# Patient Record
Sex: Male | Born: 1980 | Race: White | Hispanic: No | Marital: Married | Smoking: Current every day smoker
Health system: Southern US, Community
[De-identification: ages and names within clinical notes are randomized; demographics above are authoritative.]

## PROBLEM LIST (undated history)

## (undated) DIAGNOSIS — M1990 Unspecified osteoarthritis, unspecified site: Secondary | ICD-10-CM

## (undated) DIAGNOSIS — J40 Bronchitis, not specified as acute or chronic: Secondary | ICD-10-CM

## (undated) DIAGNOSIS — IMO0002 Reserved for concepts with insufficient information to code with codable children: Secondary | ICD-10-CM

---

## 2001-07-27 ENCOUNTER — Emergency Department (HOSPITAL_COMMUNITY): Admission: EM | Admit: 2001-07-27 | Discharge: 2001-07-27 | Payer: Self-pay | Admitting: Emergency Medicine

## 2002-07-11 ENCOUNTER — Emergency Department (HOSPITAL_COMMUNITY): Admission: EM | Admit: 2002-07-11 | Discharge: 2002-07-11 | Payer: Self-pay | Admitting: Emergency Medicine

## 2002-07-11 ENCOUNTER — Encounter: Payer: Self-pay | Admitting: Emergency Medicine

## 2005-07-30 ENCOUNTER — Emergency Department (HOSPITAL_COMMUNITY): Admission: EM | Admit: 2005-07-30 | Discharge: 2005-07-30 | Payer: Self-pay | Admitting: Emergency Medicine

## 2005-08-02 ENCOUNTER — Emergency Department (HOSPITAL_COMMUNITY): Admission: EM | Admit: 2005-08-02 | Discharge: 2005-08-02 | Payer: Self-pay | Admitting: Emergency Medicine

## 2006-04-07 ENCOUNTER — Emergency Department (HOSPITAL_COMMUNITY): Admission: EM | Admit: 2006-04-07 | Discharge: 2006-04-07 | Payer: Self-pay | Admitting: Emergency Medicine

## 2006-08-25 ENCOUNTER — Emergency Department (HOSPITAL_COMMUNITY): Admission: EM | Admit: 2006-08-25 | Discharge: 2006-08-25 | Payer: Self-pay | Admitting: Emergency Medicine

## 2009-12-22 ENCOUNTER — Emergency Department (HOSPITAL_COMMUNITY): Admission: EM | Admit: 2009-12-22 | Discharge: 2009-12-22 | Payer: Self-pay | Admitting: Emergency Medicine

## 2010-07-27 ENCOUNTER — Emergency Department (HOSPITAL_COMMUNITY): Admission: EM | Admit: 2010-07-27 | Discharge: 2010-07-28 | Payer: Self-pay | Admitting: Emergency Medicine

## 2011-01-26 ENCOUNTER — Emergency Department (HOSPITAL_COMMUNITY)
Admission: EM | Admit: 2011-01-26 | Discharge: 2011-01-27 | Disposition: A | Discharge status: Home / Self Care | Payer: Self-pay | Attending: Emergency Medicine | Admitting: Emergency Medicine

## 2011-01-26 DIAGNOSIS — S335XXA Sprain of ligaments of lumbar spine, initial encounter: Secondary | ICD-10-CM | POA: Insufficient documentation

## 2011-01-26 DIAGNOSIS — X503XXA Overexertion from repetitive movements, initial encounter: Secondary | ICD-10-CM | POA: Insufficient documentation

## 2011-01-26 DIAGNOSIS — M545 Low back pain, unspecified: Secondary | ICD-10-CM | POA: Insufficient documentation

## 2011-01-26 DIAGNOSIS — G8929 Other chronic pain: Secondary | ICD-10-CM | POA: Insufficient documentation

## 2011-01-29 ENCOUNTER — Emergency Department (HOSPITAL_COMMUNITY)
Admission: EM | Admit: 2011-01-29 | Discharge: 2011-01-29 | Disposition: A | Discharge status: Home / Self Care | Payer: Self-pay | Attending: Emergency Medicine | Admitting: Emergency Medicine

## 2011-01-29 DIAGNOSIS — M545 Low back pain, unspecified: Secondary | ICD-10-CM | POA: Insufficient documentation

## 2011-01-29 DIAGNOSIS — S335XXA Sprain of ligaments of lumbar spine, initial encounter: Secondary | ICD-10-CM | POA: Insufficient documentation

## 2011-01-29 DIAGNOSIS — F17200 Nicotine dependence, unspecified, uncomplicated: Secondary | ICD-10-CM | POA: Insufficient documentation

## 2011-01-29 DIAGNOSIS — Y929 Unspecified place or not applicable: Secondary | ICD-10-CM | POA: Insufficient documentation

## 2011-01-29 DIAGNOSIS — X503XXA Overexertion from repetitive movements, initial encounter: Secondary | ICD-10-CM | POA: Insufficient documentation

## 2011-06-26 ENCOUNTER — Emergency Department (HOSPITAL_COMMUNITY)
Admission: EM | Admit: 2011-06-26 | Discharge: 2011-06-27 | Disposition: A | Discharge status: Home / Self Care | Payer: Self-pay | Attending: Emergency Medicine | Admitting: Emergency Medicine

## 2011-06-26 ENCOUNTER — Emergency Department (HOSPITAL_COMMUNITY): Payer: Self-pay

## 2011-06-26 DIAGNOSIS — J40 Bronchitis, not specified as acute or chronic: Secondary | ICD-10-CM | POA: Insufficient documentation

## 2011-06-26 DIAGNOSIS — F17200 Nicotine dependence, unspecified, uncomplicated: Secondary | ICD-10-CM | POA: Insufficient documentation

## 2011-07-22 ENCOUNTER — Emergency Department (HOSPITAL_COMMUNITY): Payer: Self-pay

## 2011-07-22 ENCOUNTER — Emergency Department (HOSPITAL_COMMUNITY)
Admission: EM | Admit: 2011-07-22 | Discharge: 2011-07-22 | Disposition: A | Discharge status: Home / Self Care | Payer: Self-pay | Attending: Emergency Medicine | Admitting: Emergency Medicine

## 2011-07-22 ENCOUNTER — Other Ambulatory Visit: Payer: Self-pay

## 2011-07-22 ENCOUNTER — Encounter: Payer: Self-pay | Admitting: *Deleted

## 2011-07-22 DIAGNOSIS — F17200 Nicotine dependence, unspecified, uncomplicated: Secondary | ICD-10-CM | POA: Insufficient documentation

## 2011-07-22 DIAGNOSIS — J40 Bronchitis, not specified as acute or chronic: Secondary | ICD-10-CM | POA: Insufficient documentation

## 2011-07-22 DIAGNOSIS — R079 Chest pain, unspecified: Secondary | ICD-10-CM | POA: Insufficient documentation

## 2011-07-22 HISTORY — DX: Bronchitis, not specified as acute or chronic: J40

## 2011-07-22 MED ORDER — ALBUTEROL SULFATE HFA 108 (90 BASE) MCG/ACT IN AERS
2.0000 | INHALATION_SPRAY | Freq: Once | RESPIRATORY_TRACT | Status: AC
  Administered 2011-07-22: 2 via RESPIRATORY_TRACT
  Filled 2011-07-22: qty 6.7

## 2011-07-22 MED ORDER — ALBUTEROL SULFATE (5 MG/ML) 0.5% IN NEBU
2.5000 mg | INHALATION_SOLUTION | Freq: Once | RESPIRATORY_TRACT | Status: AC
  Administered 2011-07-22: 2.5 mg via RESPIRATORY_TRACT
  Filled 2011-07-22: qty 0.5

## 2011-07-22 NOTE — ED Provider Notes (Signed)
History    Scribed for Sunnie Nielsen, MD, the patient was seen in room APA12/APA12. This chart was scribed by Clarita Crane. This patient's care was started at 7:21AM.  Chief complaint chest pain  The history is provided by the patient. No language interpreter was used.   Patient is a 30 year old male c/o intermittent chest pain onset 1 week ago and persistent since with an associated productive cough. Patient reports last episodes of chest pain occurred this morning and last night. States chest pain experienced this morning has improved significantly since onset. Notes chest pain is not aggravated or relieved by anything. Denies heartburn, sour taste in mouth, diaphoresis, SOB, swelling of lower extremities. Reports signifiicant family history of heart disease and being a current smoker approximately 1 pack per day.    PAST MEDICAL HISTORY:  Past Medical History  Diagnosis Date  . Bronchitis     PAST SURGICAL HISTORY:  History reviewed. No pertinent past surgical history.  MEDICATIONS:  Previous Medications   No medications on file     ALLERGIES:  Allergies as of 07/22/2011  . (No Known Allergies)     FAMILY HISTORY:  No family history on file.   SOCIAL HISTORY: History   Social History  . Marital Status: Single    Spouse Name: N/A    Number of Children: N/A  . Years of Education: N/A   Social History Main Topics  . Smoking status: Current Everyday Smoker    Types: Cigarettes  . Smokeless tobacco: None  . Alcohol Use: Yes  . Drug Use:   . Sexually Active:    Other Topics Concern  . None   Social History Narrative  . None        Review of Systems  Constitutional: Negative for chills.  HENT: Negative for neck pain and neck stiffness.   Eyes: Negative for pain.  Genitourinary: Negative for dysuria.  Musculoskeletal: Negative for back pain.  Skin: Negative for rash.  All other systems reviewed and are negative.    Physical Exam  BP 138/85  Pulse 93   Resp 19  SpO2 98%  Physical Exam  Nursing note and vitals reviewed. Constitutional: He is oriented to person, place, and time. Vital signs are normal. He appears well-developed and well-nourished. No distress.  HENT:  Head: Normocephalic and atraumatic.  Eyes: Conjunctivae and EOM are normal. Pupils are equal, round, and reactive to light.  Neck: Trachea normal. Neck supple. No thyromegaly present.  Cardiovascular: Normal rate, regular rhythm, S1 normal, S2 normal and normal pulses.  Exam reveals no gallop and no friction rub.   No murmur heard.  No systolic murmur is present   No diastolic murmur is present  Pulses:      Radial pulses are 2+ on the right side, and 2+ on the left side.  Pulmonary/Chest: Effort normal. He has no wheezes. He has no rhonchi. He has no rales. He exhibits no tenderness.       Very mildly decreased breath sounds bilaterally.   Abdominal: Soft. Normal appearance and bowel sounds are normal. There is no tenderness. There is no CVA tenderness and negative Murphy's sign.  Musculoskeletal:       BLE:s Calves nontender, no cords or erythema, negative Homans sign  Neurological: He is alert and oriented to person, place, and time. He has normal strength. No cranial nerve deficit or sensory deficit. GCS eye subscore is 4. GCS verbal subscore is 5. GCS motor subscore is 6.  Skin: Skin  is warm and dry. No rash noted. He is not diaphoretic.  Psychiatric: His speech is normal.       Cooperative and appropriate   ED Course  Procedures  OTHER DATA REVIEWED: Nursing notes, vital signs, and past medical records reviewed. Imaging results reviewed   DIAGNOSTIC STUDIES:     LABS / RADIOLOGY: No results found for this or any previous visit.  Dg Chest 2 View  07/22/2011  *RADIOLOGY REPORT*  Clinical Data: Chest pain, smoker.  CHEST - 2 VIEW  Comparison: 06/26/2011  Findings: Mild left lung base linear opacity, likely atelectasis. Otherwise, no focal consolidation,  pleural effusion, or pneumothorax. Previously described slight peribronchial thickening is unchanged.  Cardiomediastinal contours are otherwise within normal limits.  Visualized bones and overlying soft tissues are within normal limits.  IMPRESSION: Mild linear opacity left lung base is likely subsegmental atelectasis.  Otherwise, no focal consolidation.  Original Report Authenticated By: Waneta Martins, M.D.   Dg Chest 2 View  06/26/2011  *RADIOLOGY REPORT*  Clinical Data: Cough, congestion, chest pain.  CHEST - 2 VIEW  Comparison: 07/27/2010  Findings: Slight peribronchial thickening. Heart and mediastinal contours are within normal limits.  No focal opacities or effusions.  No acute bony abnormality.  IMPRESSION: Mild bronchitic changes.  Original Report Authenticated By: Cyndie Chime, M.D.    PROCEDURES:   Date: 07/22/2011  Rate: 88  Rhythm: sinus arrhythmia  QRS Axis: normal  Intervals: normal  ST/T Wave abnormalities: normal  Conduction Disutrbances:none  Narrative Interpretation:   Old EKG Reviewed: none available     ED COURSE / COORDINATION OF CARE: 30 yo with intermittent CP and low risk for ACS, is a smoker and has no pain in the ED and a normal physical exam.  ECG reviewed no acute ischemia. CXR obtained and reviewed.   8:14AM- Patient reports he is feeling better after being administered breathing treatment. Upon re-evaluation breath sounds are improved from initial exam. Patient advised to cease smoking and to have follow up with a cardiologist/PCP who can arrange a stress test due to risks for heart disease. Physician reviewed and explained imaging and lab results with patient/family and entertained questions. Patient/family informed of intent to d/c home and agrees with plan of action set at this time.    MDM: Differential Diagnosis: bronchitis, GERD, MSK CP, doubt PE, ACS, PTX.   PLAN: Discharge home The patient is to return the emergency department if there is  any worsening of symptoms. I have reviewed the discharge instructions with the patient/family   CONDITION ON DISCHARGE: Stable   MEDICATIONS GIVEN IN THE E.D.  Medications  albuterol (PROVENTIL) (5 MG/ML) 0.5% nebulizer solution 2.5 mg (2.5 mg Nebulization Given 07/22/11 0755)     DISCHARGE MEDICATIONS: New Prescriptions   No medications on file    I personally performed the services described in this documentation, which was scribed in my presence. The recorded information has been reviewed and considered. Sunnie Nielsen, MD      Sunnie Nielsen, MD 07/22/11 1021

## 2011-07-22 NOTE — ED Notes (Signed)
LUNG SOUNDS CLEAR AND EQUAL BILATERALLY. NAD. DENIES COUGH OR COUGHING UP ANYTHING. RATES PAIN 4/10. S1 AND S2 PRESENT. SITTING UP IN BED WITH EYES OPEN  AND LIGHTS ON. DENIES ANY NEEDS. CALL BELL AT BS. WILL CONT TO MONITOR. PENDING MD EVAL.

## 2011-07-22 NOTE — ED Notes (Signed)
RT paged.

## 2011-07-22 NOTE — ED Notes (Signed)
Pt reports he has had intermittent episodes of substernal cp starting 3 weeks ago after being dx with bronchitis

## 2011-07-22 NOTE — ED Notes (Signed)
Pt states minimal pain at present. Pt states pain has been intermittent for several weeks. NAD at this time.

## 2012-04-13 ENCOUNTER — Emergency Department (HOSPITAL_COMMUNITY)
Admission: EM | Admit: 2012-04-13 | Discharge: 2012-04-13 | Disposition: A | Discharge status: Home / Self Care | Payer: Self-pay | Attending: Emergency Medicine | Admitting: Emergency Medicine

## 2012-04-13 ENCOUNTER — Encounter (HOSPITAL_COMMUNITY): Payer: Self-pay | Admitting: *Deleted

## 2012-04-13 DIAGNOSIS — M549 Dorsalgia, unspecified: Secondary | ICD-10-CM

## 2012-04-13 DIAGNOSIS — F17200 Nicotine dependence, unspecified, uncomplicated: Secondary | ICD-10-CM | POA: Insufficient documentation

## 2012-04-13 DIAGNOSIS — M545 Low back pain, unspecified: Secondary | ICD-10-CM | POA: Insufficient documentation

## 2012-04-13 HISTORY — DX: Reserved for concepts with insufficient information to code with codable children: IMO0002

## 2012-04-13 MED ORDER — HYDROCODONE-ACETAMINOPHEN 5-325 MG PO TABS: 1.0000 | ORAL_TABLET | ORAL | Status: AC | PRN

## 2012-04-13 MED ORDER — HYDROCODONE-ACETAMINOPHEN 5-325 MG PO TABS
2.0000 | ORAL_TABLET | Freq: Once | ORAL | Status: AC
  Administered 2012-04-13: 2 via ORAL
  Filled 2012-04-13: qty 2

## 2012-04-13 MED ORDER — IBUPROFEN 800 MG PO TABS: 800.0000 mg | ORAL_TABLET | Freq: Three times a day (TID) | ORAL | Status: AC

## 2012-04-13 MED ORDER — IBUPROFEN 800 MG PO TABS
800.0000 mg | ORAL_TABLET | Freq: Once | ORAL | Status: AC
  Administered 2012-04-13: 800 mg via ORAL
  Filled 2012-04-13: qty 1

## 2012-04-13 NOTE — ED Provider Notes (Signed)
History  This chart was scribed for [employer]. Colon Branch, MD by Cherlynn Perches. The patient was seen in room APA03/APA03. Patient's care was started at 0640.  CSN: [id number]  Arrival date & time 04/13/12  7829   First MD Initiated Contact with Patient 04/13/12 [phone]      Chief Complaint  Patient presents with  . Back Pain    (Consider location/radiation/quality/duration/timing/severity/associated sxs/prior treatment) The history is provided by the patient. No language interpreter was used.   Johnny Guzman is a 31 y.o. male with a history of degenerative disc disease who presents to the Emergency Department complaining of 4 days of gradual onset, unchanging, constant back pain localized to the lower back. Pt states that pain began after a few days of unusually strenuous activity at work. Pt reports that he injured his back 2 years ago and has had chronic back pain ever since. He states that the pain is usually controlled by 3-4 200mg  ibuprofen per day. However, he reports that this pain episode has not improved with ibuprofen. Pt is a current everyday smoker and uses alcohol. Pt has no PCP.  Past Medical History  Diagnosis Date  . Bronchitis   . Degenerative disc disease     History reviewed. No pertinent past surgical history.  No family history on file.  History  Substance Use Topics  . Smoking status: Current Everyday Smoker    Types: Cigarettes  . Smokeless tobacco: Not on file  . Alcohol Use: Yes      Review of Systems  Constitutional: Negative for fever and chills.  HENT: Negative for sore throat and postnasal drip.   Respiratory: Negative for cough and shortness of breath.   Cardiovascular: Negative for chest pain.  Musculoskeletal: Positive for back pain.   A complete 10 system review of systems was obtained and all systems are negative except as noted in the HPI and PMH.   Allergies  Review of patient's allergies indicates no known allergies.  Home Medications    Current Outpatient Rx  Name Route Sig Dispense Refill  . ALBUTEROL SULFATE HFA 108 (90 BASE) MCG/ACT IN AERS Inhalation Inhale 2 puffs into the lungs every 6 (six) hours as needed. FOR SHORTNESS OF BREATH, WHEEZING     . IBUPROFEN 200 MG PO TABS Oral Take 600 mg by mouth every 6 (six) hours as needed. OTC FOR PAIN       Triage Vitals: BP 152/82  Pulse 91  Temp(Src) 97.6 F (36.4 C) (Oral)  Resp 20  Ht 6' (1.829 m)  Wt 280 lb (127.007 kg)  BMI 37.97 kg/m2  SpO2 100%  Physical Exam  Vitals reviewed. Constitutional: He is oriented to person, place, and time. He appears well-developed and well-nourished.  HENT:  Head: Normocephalic and atraumatic.  Eyes: Conjunctivae are normal. Pupils are equal, round, and reactive to light.  Neck: Normal range of motion. Neck supple.  Cardiovascular: Normal rate and regular rhythm.   No murmur heard. Pulmonary/Chest: Effort normal and breath sounds normal.  Abdominal: Soft. Bowel sounds are normal.  Musculoskeletal: He exhibits no edema.       Decreased ROM of back  Neurological: He is alert and oriented to person, place, and time.  Skin: Skin is warm and dry.  Psychiatric: He has a normal mood and affect. His behavior is normal.    ED Course  Procedures (including critical care time)  DIAGNOSTIC STUDIES: Oxygen Saturation is 100% on room air, normal by my interpretation.    COORDINATION  OF CARE: 7:28AM - Advised pt on using ibuprofen and heat/ice for pain control. Patient understands and agrees with initial ED impression and plan with expectations set for ED visit.       MDM  Patient with former back injury here with recent strain to the lower back resulting in continuous pain to the lower back. He has taken ibuprofen with no relief. Given analgesics and Motrin while here.Pt stable in ED with no significant deterioration in condition.The patient appears reasonably screened and/or stabilized for discharge and I doubt any other  medical condition or other [facility] requiring further screening, evaluation, or treatment in the ED at this time prior to discharge.  I personally performed the services described in this documentation, which was scribed in my presence. The recorded information has been reviewed and considered.   MDM Reviewed: nursing note and vitals           Nicoletta Dress. Colon Branch, MD 04/13/12 1610

## 2012-04-13 NOTE — Discharge Instructions (Signed)
Apply heat of ice for comfort. Use the medicines as directed. Follow up with your doctor.   Back Pain, Adult Back pain is very common. The pain often gets better over time. The cause of back pain is usually not dangerous. Most people can learn to manage their back pain on their own.  HOME CARE   Stay active. Start with short walks on flat ground if you can. Try to walk farther each day.   Do not sit, drive, or stand in one place for more than 30 minutes. Do not stay in bed.   Do not avoid exercise or work. Activity can help your back heal faster.   Be careful when you bend or lift an object. Bend at your knees, keep the object close to you, and do not twist.   Sleep on a firm mattress. Lie on your side, and bend your knees. If you lie on your back, put a pillow under your knees.   Only take medicines as told by your doctor.   Put ice on the injured area.   Put ice in a plastic bag.   Place a towel between your skin and the bag.   Leave the ice on for 15 to 20 minutes, 3 to 4 times a day for the first 2 to 3 days. After that, you can switch between ice and heat packs.   Ask your doctor about back exercises or massage.   Avoid feeling anxious or stressed. Find good ways to deal with stress, such as exercise.  GET HELP RIGHT AWAY IF:   Your pain does not go away with rest or medicine.   Your pain does not go away in 1 week.   You have new problems.   You do not feel well.   The pain spreads into your legs.   You cannot control when you poop (bowel movement) or pee (urinate).   Your arms or legs feel weak or lose feeling (numbness).   You feel sick to your stomach (nauseous) or throw up (vomit).   You have belly (abdominal) pain.   You feel like you may pass out (faint).  MAKE SURE YOU:   Understand these instructions.   Will watch your condition.   Will get help right away if you are not doing well or get worse.  Document Released: 05/26/2008 Document Revised:  11/27/2011 Document Reviewed: 04/28/2011 [facility] Patient Information 2012 [place], [place].

## 2012-04-13 NOTE — ED Notes (Signed)
Reports hx of chronic back pain; denies recent injury; c/o lower back pain x 4 days, unimproved; here for pain control.

## 2012-12-30 IMAGING — CR DG CHEST 2V
2 series · 2 of 2 positions shown · non-contrast
Comparison: 07/27/2010

CLINICAL DATA: Cough, congestion, chest pain.

CHEST - 2 VIEW

[view not recorded (1 of 2)]
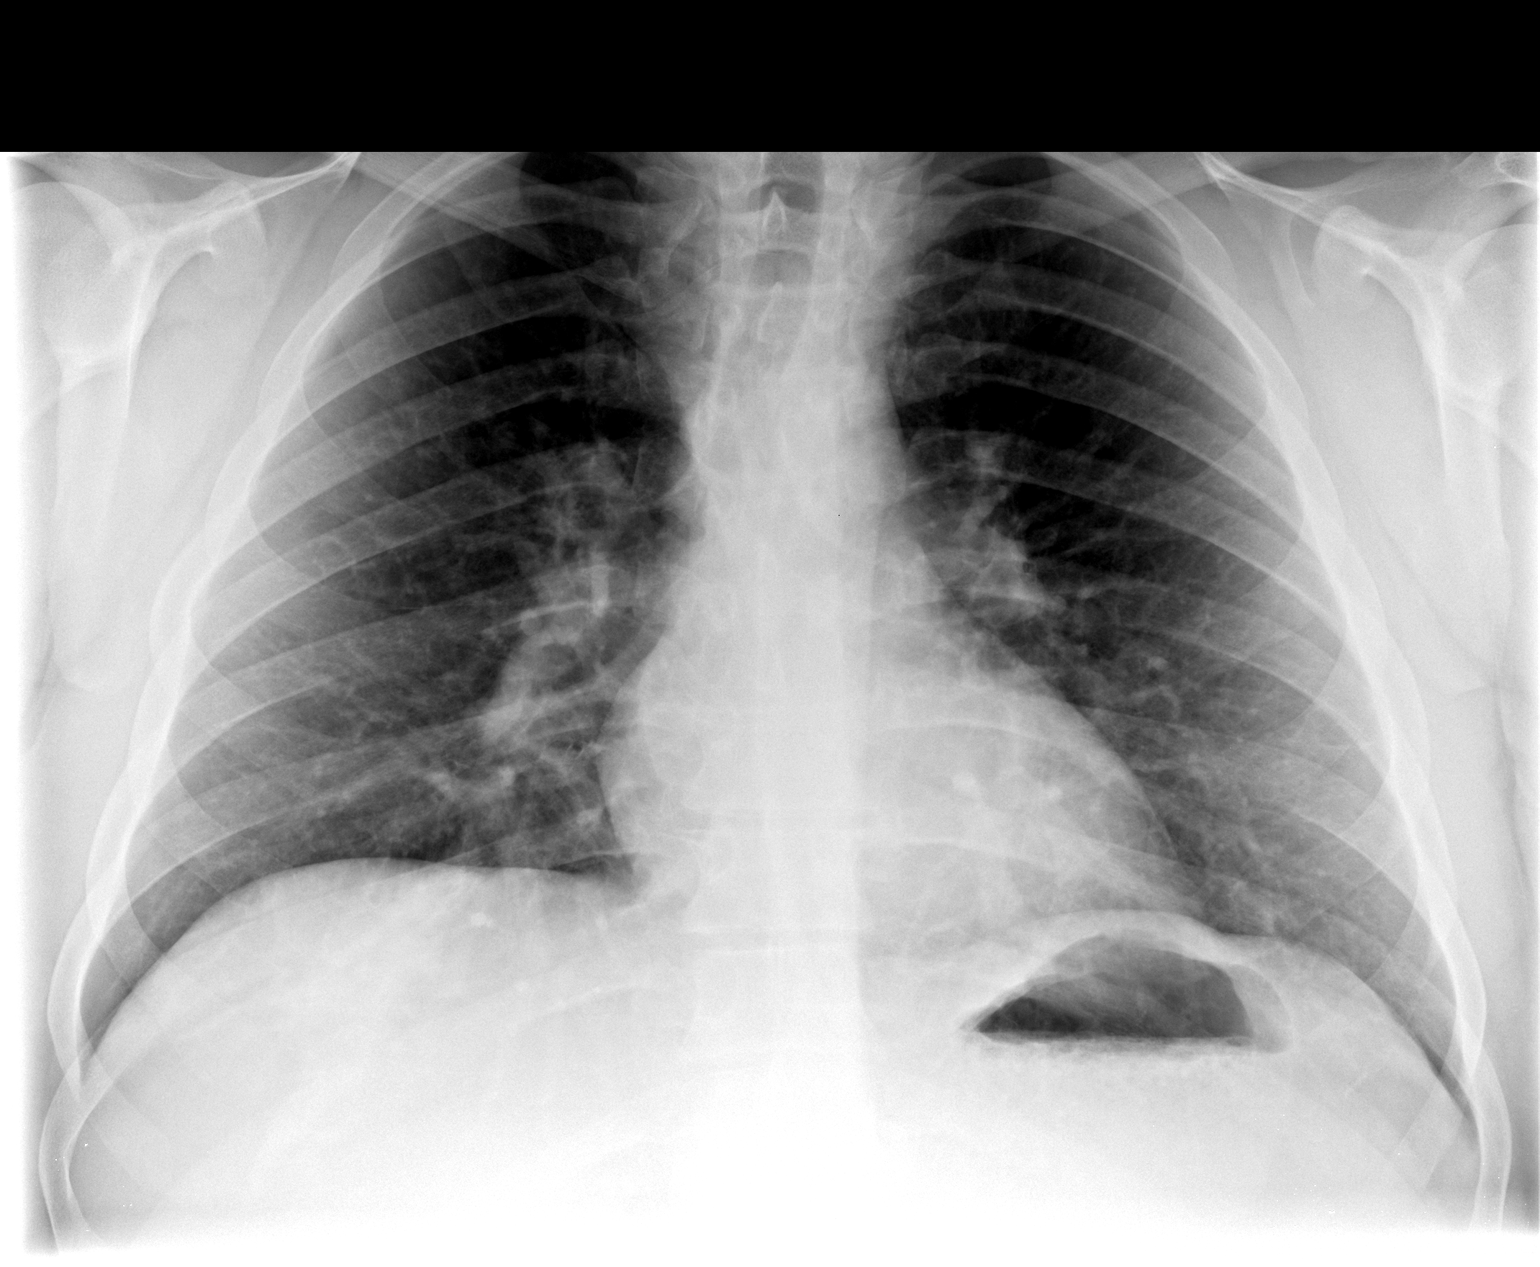

[view not recorded (2 of 2)]
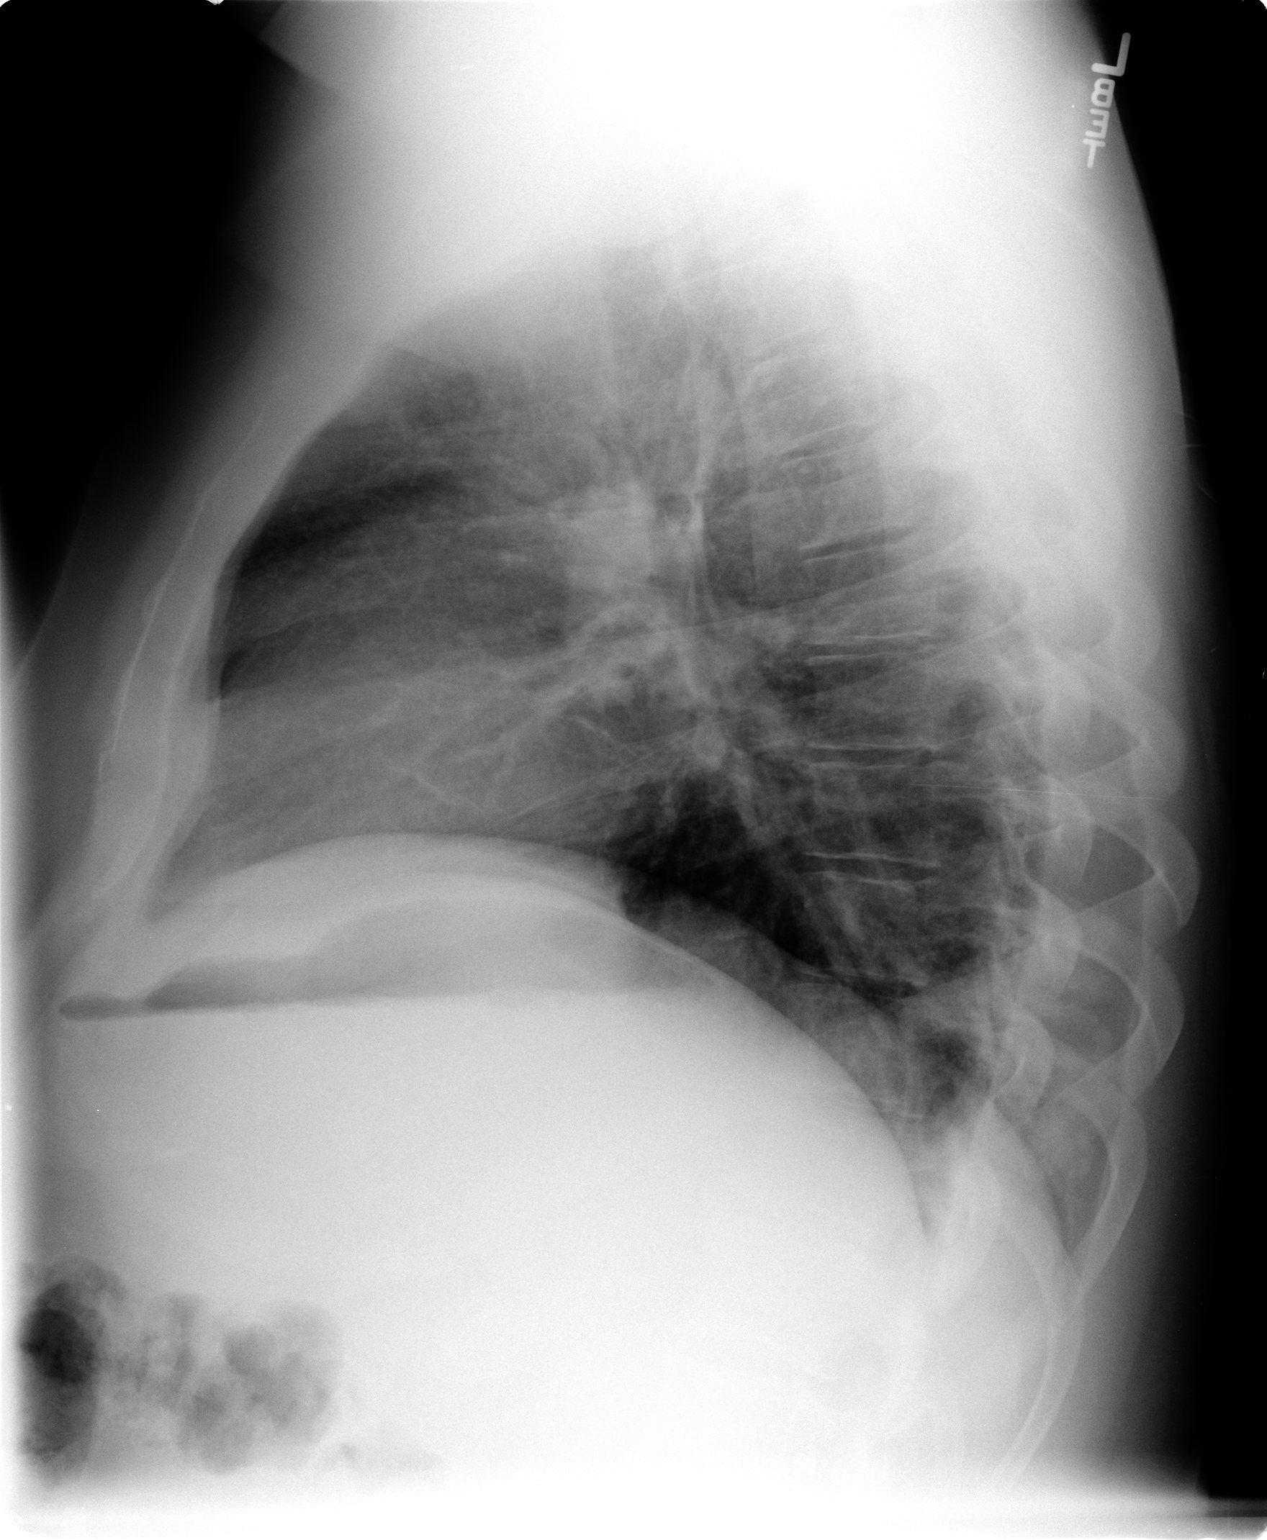

[2 of 2 positions shown; findings below may reference images not displayed]

FINDINGS: Slight peribronchial thickening. Heart and mediastinal
contours are within normal limits.  No focal opacities or
effusions.  No acute bony abnormality.
IMPRESSION: Mild bronchitic changes.

## 2015-01-16 ENCOUNTER — Emergency Department (HOSPITAL_COMMUNITY)
Admission: EM | Admit: 2015-01-16 | Discharge: 2015-01-16 | Disposition: A | Discharge status: Home / Self Care | Payer: BLUE CROSS/BLUE SHIELD | Attending: Emergency Medicine | Admitting: Emergency Medicine

## 2015-01-16 ENCOUNTER — Encounter (HOSPITAL_COMMUNITY): Payer: Self-pay | Admitting: Emergency Medicine

## 2015-01-16 DIAGNOSIS — S39012A Strain of muscle, fascia and tendon of lower back, initial encounter: Secondary | ICD-10-CM | POA: Insufficient documentation

## 2015-01-16 DIAGNOSIS — Y9389 Activity, other specified: Secondary | ICD-10-CM | POA: Diagnosis not present

## 2015-01-16 DIAGNOSIS — Y998 Other external cause status: Secondary | ICD-10-CM | POA: Diagnosis not present

## 2015-01-16 DIAGNOSIS — S3992XA Unspecified injury of lower back, initial encounter: Secondary | ICD-10-CM | POA: Diagnosis present

## 2015-01-16 DIAGNOSIS — Z8709 Personal history of other diseases of the respiratory system: Secondary | ICD-10-CM | POA: Insufficient documentation

## 2015-01-16 DIAGNOSIS — G8929 Other chronic pain: Secondary | ICD-10-CM | POA: Insufficient documentation

## 2015-01-16 DIAGNOSIS — Y9289 Other specified places as the place of occurrence of the external cause: Secondary | ICD-10-CM | POA: Diagnosis not present

## 2015-01-16 DIAGNOSIS — X58XXXA Exposure to other specified factors, initial encounter: Secondary | ICD-10-CM | POA: Insufficient documentation

## 2015-01-16 DIAGNOSIS — Z720 Tobacco use: Secondary | ICD-10-CM | POA: Diagnosis not present

## 2015-01-16 DIAGNOSIS — Z8739 Personal history of other diseases of the musculoskeletal system and connective tissue: Secondary | ICD-10-CM | POA: Diagnosis not present

## 2015-01-16 MED ORDER — CYCLOBENZAPRINE HCL 10 MG PO TABS: 10.0000 mg | ORAL_TABLET | Freq: Three times a day (TID) | ORAL | Status: DC | PRN

## 2015-01-16 MED ORDER — OXYCODONE-ACETAMINOPHEN 5-325 MG PO TABS: 1.0000 | ORAL_TABLET | ORAL | Status: DC | PRN

## 2015-01-16 MED ORDER — PREDNISONE 10 MG PO TABS: ORAL_TABLET | ORAL | Status: DC

## 2015-01-16 NOTE — ED Notes (Signed)
Pt reports slipping on the ice yesterday. Did not fall but twisted his back.

## 2015-01-16 NOTE — ED Notes (Signed)
Low back pain that radiates up to rt post thorax after slipping on ice, Did not fall, but was carrying groceries and twisted his back.

## 2015-01-16 NOTE — Discharge Instructions (Signed)

## 2015-01-16 NOTE — ED Provider Notes (Signed)
CSN: [id number]     Arrival date & time 01/16/15  1121 History   First MD Initiated Contact with Patient 01/16/15 1320     Chief Complaint  Patient presents with  . Back Pain     (Consider location/radiation/quality/duration/timing/severity/associated sxs/prior Treatment) HPI   Johnny Guzman is a 34 y.o. male who presents to the Emergency Department complaining of low back pain for one day.  He reports hx of chronic low back pain, but pain became worse after a twisting injury after slipping on ice.  He denies fall , but describes a aching pain across his lower back that is worse with movement and improves with rest.  He has taken motrin without relief.  He denies abd pain, numbness or weakness of the lower extremities or pelvis, urine or bowel changes.    Past Medical History  Diagnosis Date  . Bronchitis   . Degenerative disc disease    History reviewed. No pertinent past surgical history. Family History  Problem Relation Age of Onset  . Hypertension Mother   . Heart failure Mother   . Hypertension Other   . Heart failure Other    History  Substance Use Topics  . Smoking status: Current Every Day Smoker -- 1.00 packs/day    Types: Cigarettes  . Smokeless tobacco: Former NeurosurgeonUser  . Alcohol Use: Yes    Review of Systems  Constitutional: Negative for fever.  Respiratory: Negative for shortness of breath.   Gastrointestinal: Negative for vomiting, abdominal pain and constipation.  Genitourinary: Negative for dysuria, hematuria, flank pain, decreased urine volume and difficulty urinating.  Musculoskeletal: Positive for back pain. Negative for joint swelling.  Skin: Negative for rash.  Neurological: Negative for weakness and numbness.  All other systems reviewed and are negative.     Allergies  Review of patient's allergies indicates no known allergies.  Home Medications   Prior to Admission medications   Medication Sig Start Date End Date Taking? Authorizing Provider   ibuprofen (ADVIL,MOTRIN) 200 MG tablet Take 600 mg by mouth every 6 (six) hours as needed. OTC FOR PAIN    Yes Historical Provider, MD   BP 123/87 mmHg  Pulse 79  Temp(Src) 98.7 F (37.1 C) (Oral)  Resp 18  Ht 6' (1.829 m)  Wt 290 lb (131.543 kg)  BMI 39.32 kg/m2  SpO2 98% Physical Exam  Constitutional: He is oriented to person, place, and time. He appears well-developed and well-nourished. No distress.  HENT:  Head: Normocephalic and atraumatic.  Neck: Normal range of motion. Neck supple.  Cardiovascular: Normal rate, regular rhythm, normal heart sounds and intact distal pulses.   No murmur heard. Pulmonary/Chest: Effort normal and breath sounds normal. No respiratory distress.  Abdominal: Soft. He exhibits no distension. There is no tenderness.  Musculoskeletal: He exhibits tenderness. He exhibits no edema.       Lumbar back: He exhibits tenderness and pain. He exhibits normal range of motion, no swelling, no deformity, no laceration and normal pulse.  ttp of the bilateral lumbar paraspinal muscles.  No spinal tenderness.  DP pulses are brisk and symmetrical.  Distal sensation intact.  Hip Flexors/Extensors are intact.  Pt has 5/5 strength against resistance of bilateral lower extremities.     Neurological: He is alert and oriented to person, place, and time. He has normal strength. No sensory deficit. He exhibits normal muscle tone. Coordination and gait normal.  Reflex Scores:      Patellar reflexes are 2+ on the right side and 2+  on the left side.      Achilles reflexes are 2+ on the right side and 2+ on the left side. Skin: Skin is warm and dry. No rash noted.  Nursing note and vitals reviewed.   ED Course  Procedures (including critical care time) Labs Review Labs Reviewed - No data to display  Imaging Review  No results found.   EKG Interpretation None      MDM   Final diagnoses:  Lumbar strain, initial encounter    Right sided low back pain w/o  radiculopathy.  No focal neuro deficits. Likely acute on chronic pain.  No concerning sx for emergent neurological or infectious process.  Pt ambulates with steady gait.  Agrees to arrange f/u with PMD .  Appears stable for d/c  Saida Lonon L. Trisha Mangle, PA-C 01/17/15 1742  Samuel Jester, DO 01/20/15 [id number]

## 2015-01-29 ENCOUNTER — Encounter (HOSPITAL_COMMUNITY): Payer: Self-pay | Admitting: *Deleted

## 2015-01-29 ENCOUNTER — Emergency Department (HOSPITAL_COMMUNITY)
Admission: EM | Admit: 2015-01-29 | Discharge: 2015-01-29 | Disposition: A | Discharge status: Home / Self Care | Payer: BLUE CROSS/BLUE SHIELD | Attending: Emergency Medicine | Admitting: Emergency Medicine

## 2015-01-29 DIAGNOSIS — S29012A Strain of muscle and tendon of back wall of thorax, initial encounter: Secondary | ICD-10-CM | POA: Diagnosis not present

## 2015-01-29 DIAGNOSIS — Y998 Other external cause status: Secondary | ICD-10-CM | POA: Diagnosis not present

## 2015-01-29 DIAGNOSIS — Y9389 Activity, other specified: Secondary | ICD-10-CM | POA: Insufficient documentation

## 2015-01-29 DIAGNOSIS — S29019A Strain of muscle and tendon of unspecified wall of thorax, initial encounter: Secondary | ICD-10-CM

## 2015-01-29 DIAGNOSIS — M545 Low back pain, unspecified: Secondary | ICD-10-CM

## 2015-01-29 DIAGNOSIS — Z8709 Personal history of other diseases of the respiratory system: Secondary | ICD-10-CM | POA: Insufficient documentation

## 2015-01-29 DIAGNOSIS — S3992XA Unspecified injury of lower back, initial encounter: Secondary | ICD-10-CM | POA: Insufficient documentation

## 2015-01-29 DIAGNOSIS — Y9289 Other specified places as the place of occurrence of the external cause: Secondary | ICD-10-CM | POA: Insufficient documentation

## 2015-01-29 DIAGNOSIS — W001XXA Fall from stairs and steps due to ice and snow, initial encounter: Secondary | ICD-10-CM | POA: Insufficient documentation

## 2015-01-29 DIAGNOSIS — Z720 Tobacco use: Secondary | ICD-10-CM | POA: Diagnosis not present

## 2015-01-29 DIAGNOSIS — Z8739 Personal history of other diseases of the musculoskeletal system and connective tissue: Secondary | ICD-10-CM | POA: Insufficient documentation

## 2015-01-29 DIAGNOSIS — S24109A Unspecified injury at unspecified level of thoracic spinal cord, initial encounter: Secondary | ICD-10-CM | POA: Diagnosis present

## 2015-01-29 MED ORDER — CELECOXIB 100 MG PO CAPS: 100.0000 mg | ORAL_CAPSULE | Freq: Two times a day (BID) | ORAL | Status: DC

## 2015-01-29 MED ORDER — DEXAMETHASONE 6 MG PO TABS: ORAL_TABLET | ORAL | Status: DC

## 2015-01-29 MED ORDER — METHOCARBAMOL 500 MG PO TABS: 500.0000 mg | ORAL_TABLET | Freq: Three times a day (TID) | ORAL | Status: DC

## 2015-01-29 NOTE — ED Provider Notes (Signed)
CSN: [id number]     Arrival date & time 01/29/15  1131 History  This chart was scribed for Johnny Quale, PA-C with Johnny Mulders, MD by Johnny Guzman, ED Scribe. This patient was seen in room [facility]21/[facility] and the patient's care was started at 2:19 PM.    Chief Complaint  Patient presents with  . Back Pain   Patient is a 34 y.o. male presenting with back pain. The history is provided by the patient. No language interpreter was used.  Back Pain Location:  Thoracic spine and lumbar spine Quality: spasm. Radiates to:  Does not radiate Pain severity:  Moderate Pain is:  Same all the time Onset quality:  Sudden Duration:  2 weeks Timing:  Constant Progression:  Worsening Chronicity:  New Context: falling   Exacerbated by: exertion at work. Ineffective treatments:  None tried Associated symptoms: no abdominal pain, no bladder incontinence, no bowel incontinence, no chest pain, no dysuria and no numbness   Risk factors: no recent surgery   Risk factors comment:  Hx of degenerative disc disease   HPI Comments: Johnny Guzman is a 34 y.o. male with history of degenerative disc disease who presents to the Emergency Department complaining of back pain status post slipping and falling on ice on steps on 1/26, worsening yesterday. He states he was evaluated here after the fall and was prescribed medication and given work note, but states he has gone back to work since. 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 He states symptoms worsened on waking yesterday. He states he has some chronic back pain from degenerative disc disease, but states it is worse and has spasms. He has received referral to [facility] but needs help setting up appointment.  Past Medical History  Diagnosis Date  . Bronchitis   . Degenerative disc disease    History reviewed. No pertinent past surgical history. Family History  Problem Relation Age of Onset  .  Hypertension Mother   . Heart failure Mother   . Hypertension Other   . Heart failure Other    History  Substance Use Topics  . Smoking status: Current Every Day Smoker -- 1.00 packs/day    Types: Cigarettes  . Smokeless tobacco: Former Neurosurgeon  . Alcohol Use: Yes    Review of Systems  Constitutional: Negative for activity change.       All ROS Neg except as noted in HPI  HENT: Negative.   Eyes: Negative for photophobia and discharge.  Respiratory: Negative for cough, shortness of breath and wheezing.   Cardiovascular: Negative for chest pain and palpitations.  Gastrointestinal: Negative for abdominal pain, blood in stool and bowel incontinence.  Genitourinary: Negative for bladder incontinence, dysuria, frequency and hematuria.  Musculoskeletal: Positive for back pain. Negative for arthralgias and neck pain.  Skin: Negative.   Neurological: Negative for dizziness, seizures, speech difficulty and numbness.  Psychiatric/Behavioral: Negative for hallucinations and confusion.  All other systems reviewed and are negative.   Allergies  Review of patient's allergies indicates no known allergies.  Home Medications   Prior to Admission medications   Medication Sig Start Date End Date Taking? Authorizing Provider  aspirin 325 MG tablet Take 650 mg by mouth daily as needed for mild pain.   Yes Historical Provider, MD  ibuprofen (ADVIL,MOTRIN) 200 MG tablet Take 600 mg by mouth every 6 (six) hours as needed for moderate pain.    Yes Historical Provider, MD  cyclobenzaprine (FLEXERIL)  10 MG tablet Take 1 tablet (10 mg total) by mouth 3 (three) times daily as needed. Patient not taking: Reported on 01/29/2015 01/16/15   Tammy L. Triplett, PA-C  oxyCODONE-acetaminophen (PERCOCET/ROXICET) 5-325 MG per tablet Take 1 tablet by mouth every 4 (four) hours as needed. Patient not taking: Reported on 01/29/2015 01/16/15   Tammy L. Triplett, PA-C  predniSONE (DELTASONE) 10 MG tablet Take 6 tablets day one,  5 tablets day two, 4 tablets day three, 3 tablets day four, 2 tablets day five, then 1 tablet day six Patient not taking: Reported on 01/29/2015 01/16/15   Tammy L. Triplett, PA-C   BP 134/85 mmHg  Pulse 81  Temp(Src) 98 F (36.7 C) (Oral)  Resp 22  Ht 6' (1.829 m)  Wt 290 lb (131.543 kg)  BMI 39.32 kg/m2  SpO2 99% Physical Exam  Constitutional: He is oriented to person, place, and time. He appears well-developed and well-nourished.  HENT:  Head: Normocephalic and atraumatic.  Eyes: Conjunctivae are normal.  Neck: Normal range of motion. Neck supple.  Pulmonary/Chest: Effort normal.  Musculoskeletal: Normal range of motion.  Paraspinal tenderness from just beneath the scapula on the right and left extending to the mid thoracic area Some spasm palpated Mild paraspinal pain in the lower lumbar  Neurological: He is alert and oriented to person, place, and time.  No motor or sensory deficit  Skin: Skin is warm and dry.  Psychiatric: He has a normal mood and affect.  Nursing note and vitals reviewed.   ED Course  Procedures (including critical care time)  DIAGNOSTIC STUDIES: Oxygen Saturation is 99% on room air, normal by my interpretation.    COORDINATION OF CARE: 2:30 PM Discussed treatment plan with patient at beside, including Robaxin and Celebrex with follow up with [facility]. Instructed him to use heat and rest. The patient agrees with the plan and has no further questions at this time.   Labs Review Labs Reviewed - No data to display  Imaging Review No results found.   EKG Interpretation None      MDM  No new neuro deficits noted on today's exam. Vital signs stable.  [facility]. Rx for celebrex, robaxin and decadron given to the  Patient.   Final diagnoses:  Strain of thoracic region, initial encounter  Midline low back pain without sciatica    **I have reviewed nursing notes, vital signs, and all appropriate lab and  imaging results for this patient.*  **I personally performed the services described in this documentation, which was scribed in my presence. The recorded information has been reviewed and is accurate.Johnny Guzman*  Johnny Illingworth M Dakarai Mcglocklin, PA-C 01/31/15 1051  Johnny MuldersScott Zackowski, MD 02/01/15 [phone]

## 2015-01-29 NOTE — ED Notes (Signed)
Low back pain since slipped on ice 1/26, seen here , but pain continues

## 2015-01-29 NOTE — Discharge Instructions (Signed)
Back Pain, Adult Low back pain is very common. About 1 in 5 people have back pain.The cause of low back pain is rarely dangerous. The pain often gets better over time.About half of people with a sudden onset of back pain feel better in just 2 weeks. About 8 in 10 people feel better by 6 weeks.  CAUSES Some common causes of back pain include:  Strain of the muscles or ligaments supporting the spine.  Wear and tear (degeneration) of the spinal discs.  Arthritis.  Direct injury to the back. DIAGNOSIS Most of the time, the direct cause of low back pain is not known.However, back pain can be treated effectively even when the exact cause of the pain is unknown.Answering your caregiver's questions about your overall health and symptoms is one of the most accurate ways to make sure the cause of your pain is not dangerous. If your caregiver needs more information, he or she may order lab work or imaging tests (X-rays or MRIs).However, even if imaging tests show changes in your back, this usually does not require surgery. HOME CARE INSTRUCTIONS For many people, back pain returns.Since low back pain is rarely dangerous, it is often a condition that people can learn to manageon their own.   Remain active. It is stressful on the back to sit or stand in one place. Do not sit, drive, or stand in one place for more than 30 minutes at a time. Take short walks on level surfaces as soon as pain allows.Try to increase the length of time you walk each day.  Do not stay in bed.Resting more than 1 or 2 days can delay your recovery.  Do not avoid exercise or work.Your body is made to move.It is not dangerous to be active, even though your back may hurt.Your back will likely heal faster if you return to being active before your pain is gone.  Pay attention to your body when you bend and lift. Many people have less discomfortwhen lifting if they bend their knees, keep the load close to their bodies,and  avoid twisting. Often, the most comfortable positions are those that put less stress on your recovering back.  Find a comfortable position to sleep. Use a firm mattress and lie on your side with your knees slightly bent. If you lie on your back, put a pillow under your knees.  Only take over-the-counter or prescription medicines as directed by your caregiver. Over-the-counter medicines to reduce pain and inflammation are often the most helpful.Your caregiver may prescribe muscle relaxant drugs.These medicines help dull your pain so you can more quickly return to your normal activities and healthy exercise.  Put ice on the injured area.  Put ice in a plastic bag.  Place a towel between your skin and the bag.  Leave the ice on for 15-20 minutes, 03-04 times a day for the first 2 to 3 days. After that, ice and heat may be alternated to reduce pain and spasms.  Ask your caregiver about trying back exercises and gentle massage. This may be of some benefit.  Avoid feeling anxious or stressed.Stress increases muscle tension and can worsen back pain.It is important to recognize when you are anxious or stressed and learn ways to manage it.Exercise is a great option. SEEK MEDICAL CARE IF:  You have pain that is not relieved with rest or medicine.  You have pain that does not improve in 1 week.  You have new symptoms.  You are generally not feeling well. SEEK   IMMEDIATE MEDICAL CARE IF:   You have pain that radiates from your back into your legs.  You develop new bowel or bladder control problems.  You have unusual weakness or numbness in your arms or legs.  You develop nausea or vomiting.  You develop abdominal pain.  You feel faint. Document Released: 12/08/2005 Document Revised: 06/08/2012 Document Reviewed: 04/11/2014 ExitCare Patient Information 2015 [employer]. This information is not intended to replace advice given to you by your health care provider. Make sure you  discuss any questions you have with your health care provider.  

## 2016-01-02 ENCOUNTER — Emergency Department (HOSPITAL_COMMUNITY)
Admission: EM | Admit: 2016-01-02 | Discharge: 2016-01-02 | Disposition: A | Discharge status: Home / Self Care | Payer: BLUE CROSS/BLUE SHIELD | Attending: Emergency Medicine | Admitting: Emergency Medicine

## 2016-01-02 ENCOUNTER — Encounter (HOSPITAL_COMMUNITY): Payer: Self-pay

## 2016-01-02 DIAGNOSIS — Z79899 Other long term (current) drug therapy: Secondary | ICD-10-CM | POA: Insufficient documentation

## 2016-01-02 DIAGNOSIS — J029 Acute pharyngitis, unspecified: Secondary | ICD-10-CM | POA: Insufficient documentation

## 2016-01-02 DIAGNOSIS — F17210 Nicotine dependence, cigarettes, uncomplicated: Secondary | ICD-10-CM | POA: Insufficient documentation

## 2016-01-02 DIAGNOSIS — G510 Bell's palsy: Secondary | ICD-10-CM | POA: Insufficient documentation

## 2016-01-02 DIAGNOSIS — Z7982 Long term (current) use of aspirin: Secondary | ICD-10-CM | POA: Insufficient documentation

## 2016-01-02 DIAGNOSIS — Z8739 Personal history of other diseases of the musculoskeletal system and connective tissue: Secondary | ICD-10-CM | POA: Insufficient documentation

## 2016-01-02 MED ORDER — ACYCLOVIR 400 MG PO TABS: 400.0000 mg | ORAL_TABLET | Freq: Every day | ORAL | Status: DC

## 2016-01-02 MED ORDER — DEXAMETHASONE 6 MG PO TABS: 6.0000 mg | ORAL_TABLET | Freq: Two times a day (BID) | ORAL | Status: DC

## 2016-01-02 NOTE — ED Notes (Signed)
Pt reports pain in r ear, r side of face, and throat was hurting x 2 or 3 days.    Pt says this morning his eye has been watery and having difficulty blinking.  Also noticed r facial droop.  Denies any weakness on one side of the other.

## 2016-01-02 NOTE — Discharge Instructions (Signed)
Bell Palsy °Bell palsy is a condition in which the muscles on one side of the face become paralyzed. This often causes one side of the face to droop. It is a common condition and most people recover completely. °RISK FACTORS °Risk factors for Bell palsy include: °· Pregnancy. °· Diabetes. °· An infection by a virus, such as infections that cause cold sores. °CAUSES  °Bell palsy is caused by damage to or inflammation of a nerve in your face. It is unclear why this happens, but an infection by a virus may lead to it. Most of the time the reason it happens is unknown. °SIGNS AND SYMPTOMS  °Symptoms can range from mild to severe and can take place over a number of hours. Symptoms may include: °· Being unable to: °¨ Raise one or both eyebrows. °¨ Close one or both eyes. °¨ Feel parts of your face (facial numbness). °· Drooping of the eyelid and corner of the mouth. °· Weakness in the face. °· Paralysis of half your face. °· Loss of taste. °· Sensitivity to loud noises. °· Difficulty chewing. °· Tearing up of the affected eye. °· Dryness in the affected eye. °· Drooling. °· Pain behind one ear. °DIAGNOSIS  °Diagnosis of Bell palsy may include: °· A medical history and physical exam. °· An MRI. °· A CT scan. °· Electromyography (EMG). This is a test that checks how your nerves are working. °TREATMENT  °Treatment may include antiviral medicine to help shorten the length of the condition. Sometimes treatment is not needed and the symptoms go away on their own. °HOME CARE INSTRUCTIONS  °· Take medicines only as directed by your health care provider. °· Do facial massages and exercises as directed by your health care provider. °· If your eye is affected: °¨ Use moisturizing eye drops to prevent drying of your eye as directed by your health care provider. °¨ Protect your eye as directed by your health care provider. °SEEK MEDICAL CARE IF: °· Your symptoms do not get better or get worse. °· You are drooling. °· Your eye is red,  irritated, or hurts. °SEEK IMMEDIATE MEDICAL CARE IF:  °· Another part of your body feels weak or numb. °· You have difficulty swallowing. °· You have a fever along with symptoms of Bell palsy. °· You develop neck pain. °MAKE SURE YOU:  °· Understand these instructions. °· Will watch your condition. °· Will get help right away if you are not doing well or get worse. °  °This information is not intended to replace advice given to you by your health care provider. Make sure you discuss any questions you have with your health care provider. °  °Document Released: 12/08/2005 Document Revised: 08/29/2015 Document Reviewed: 03/17/2014 °Elsevier Interactive Patient Education ©2016 [employer] ° °

## 2016-01-05 NOTE — ED Provider Notes (Signed)
CSN: [id number]     Arrival date & time 01/02/16  1538 History   First MD Initiated Contact with Patient 01/02/16 1600     Chief Complaint  Patient presents with  . Bell's Palsy      (Consider location/radiation/quality/duration/timing/severity/associated sxs/prior Treatment) HPI   Johnny Guzman is a 35 y.o. male who presents to the Emergency Department complaining of sudden onset of right sided facial weakness.  He describes inability to completely close the right eye, excessive tearing of the eye, and drooping of right side of his mouth.  He states that he noticed right ear pain and sore throat for 3 days prior.  He also has associated decreased taste sensation.  He denies headache, dizziness, weakness of the arms or legs, difficulty speaking or swallowing, or change in his vision.  He also denies rash, fever, cough or neck pain.  He has not taken any medications for his symptoms.    Past Medical History  Diagnosis Date  . Bronchitis   . Degenerative disc disease    History reviewed. No pertinent past surgical history. Family History  Problem Relation Age of Onset  . Hypertension Mother   . Heart failure Mother   . Hypertension Other   . Heart failure Other    Social History  Substance Use Topics  . Smoking status: Current Every Day Smoker -- 1.00 packs/day    Types: Cigarettes  . Smokeless tobacco: Former NeurosurgeonUser  . Alcohol Use: Yes     Comment: no etoh 1 year    Review of Systems  Constitutional: Negative for fever, chills, activity change and appetite change.  HENT: Positive for ear pain and sore throat. Negative for ear discharge, facial swelling, trouble swallowing and voice change.   Eyes: Negative for pain.       Excessive tearing right eye  Gastrointestinal: Negative for nausea and vomiting.  Musculoskeletal: Negative for arthralgias, neck pain and neck stiffness.  Skin: Negative for rash.  Neurological: Positive for facial asymmetry. Negative for dizziness,  syncope, speech difficulty, weakness, light-headedness, numbness and headaches.  Hematological: Negative for adenopathy.  Psychiatric/Behavioral: Negative for confusion and decreased concentration. The patient is not nervous/anxious.       Allergies  Review of patient's allergies indicates no known allergies.  Home Medications   Prior to Admission medications   Medication Sig Start Date End Date Taking? Authorizing Provider  acyclovir (ZOVIRAX) 400 MG tablet Take 1 tablet (400 mg total) by mouth 5 (five) times daily. 01/02/16   Isbella Arline, PA-C  aspirin 325 MG tablet Take 650 mg by mouth daily as needed for mild pain.    Historical Provider, MD  celecoxib (CELEBREX) 100 MG capsule Take 1 capsule (100 mg total) by mouth 2 (two) times daily. Patient not taking: Reported on 01/02/2016 01/29/15   Ivery QualeHobson Bryant, PA-C  cyclobenzaprine (FLEXERIL) 10 MG tablet Take 1 tablet (10 mg total) by mouth 3 (three) times daily as needed. Patient not taking: Reported on 01/29/2015 01/16/15   Ascencion Coye, PA-C  dexamethasone (DECADRON) 6 MG tablet Take 1 tablet (6 mg total) by mouth 2 (two) times daily. For 7 days 01/02/16   Madysun Thall, PA-C  ibuprofen (ADVIL,MOTRIN) 200 MG tablet Take 600 mg by mouth every 6 (six) hours as needed for moderate pain.     Historical Provider, MD  methocarbamol (ROBAXIN) 500 MG tablet Take 1 tablet (500 mg total) by mouth 3 (three) times daily. Patient not taking: Reported on 01/02/2016 01/29/15   Ivery QualeHobson Bryant,  PA-C  oxyCODONE-acetaminophen (PERCOCET/ROXICET) 5-325 MG per tablet Take 1 tablet by mouth every 4 (four) hours as needed. Patient not taking: Reported on 01/29/2015 01/16/15   Karine Garn, PA-C  predniSONE (DELTASONE) 10 MG tablet Take 6 tablets day one, 5 tablets day two, 4 tablets day three, 3 tablets day four, 2 tablets day five, then 1 tablet day six Patient not taking: Reported on 01/29/2015 01/16/15   Lealon Vanputten, PA-C   BP 141/99 mmHg  Pulse 88  Temp(Src)  98.5 F (36.9 C) (Oral)  Resp 16  Ht 6' (1.829 m)  Wt 146.058 kg  BMI 43.66 kg/m2  SpO2 96% Physical Exam  Constitutional: He is oriented to person, place, and time. He appears well-developed and well-nourished. No distress.  HENT:  Head: Atraumatic.  Right Ear: Ear canal normal. Tympanic membrane is erythematous.  Left Ear: Tympanic membrane and ear canal normal.  Mouth/Throat: Uvula is midline, oropharynx is clear and moist and mucous membranes are normal.  Eyes: Conjunctivae are normal. Pupils are equal, round, and reactive to light.  Neck: Normal range of motion. Neck supple.  Cardiovascular: Normal rate, regular rhythm and intact distal pulses.   Pulmonary/Chest: Effort normal and breath sounds normal. No respiratory distress.  Musculoskeletal: Normal range of motion.  5/5 strength against resistance of bilateral upper and lower extremities.    Neurological: He is alert and oriented to person, place, and time. Gait normal.  Reflex Scores:      Tricep reflexes are 2+ on the right side and 2+ on the left side.      Bicep reflexes are 2+ on the left side.      Patellar reflexes are 2+ on the right side and 2+ on the left side.      Achilles reflexes are 2+ on the right side and 2+ on the left side. Dysfunction of the right 7th cranial nerve.  Pt unable to completely close the right eyelid or raise the right eyebrow.  Mild droop of the right mouth.  Excessive tearing of right eye.  speech is clear.    Skin: Skin is warm. No rash noted.  Psychiatric: He has a normal mood and affect. Thought content normal.  Nursing note and vitals reviewed.   ED Course  Procedures (including critical care time) Labs Review Labs Reviewed - No data to display  Imaging Review No results found. I have personally reviewed and evaluated these images and lab results as part of my medical decision-making.   EKG Interpretation None      MDM   Final diagnoses:  Bell palsy    Pt is well  appearing, ambulates with a steady gait.  Neuro deficits are localized to the right face.  Sx's appear c/w Bell's Palsy.  Mild erythema of the right TM w/o facial rash or redness to the canal or external ear.  Ramsay Hunt syndrome also considered.  Will tx with steroids and anti-virals.  Discussed findings with Dr. Clarene Duke.  Pt appears stable for d/c and agrees to close PMD or neuro f/u or to return here if sx's worsen.      Pauline Aus, PA-C 01/05/16 1757  Samuel Jester, DO 01/05/16 2117

## 2016-01-13 ENCOUNTER — Emergency Department (HOSPITAL_COMMUNITY)
Admission: EM | Admit: 2016-01-13 | Discharge: 2016-01-13 | Disposition: A | Discharge status: Home / Self Care | Payer: Self-pay | Attending: Emergency Medicine | Admitting: Emergency Medicine

## 2016-01-13 ENCOUNTER — Encounter (HOSPITAL_COMMUNITY): Payer: Self-pay | Admitting: Cardiology

## 2016-01-13 DIAGNOSIS — H9201 Otalgia, right ear: Secondary | ICD-10-CM | POA: Insufficient documentation

## 2016-01-13 DIAGNOSIS — Z8709 Personal history of other diseases of the respiratory system: Secondary | ICD-10-CM | POA: Insufficient documentation

## 2016-01-13 DIAGNOSIS — F17210 Nicotine dependence, cigarettes, uncomplicated: Secondary | ICD-10-CM | POA: Insufficient documentation

## 2016-01-13 DIAGNOSIS — Z7982 Long term (current) use of aspirin: Secondary | ICD-10-CM | POA: Insufficient documentation

## 2016-01-13 DIAGNOSIS — Z79899 Other long term (current) drug therapy: Secondary | ICD-10-CM | POA: Insufficient documentation

## 2016-01-13 MED ORDER — LORATADINE-PSEUDOEPHEDRINE ER 5-120 MG PO TB12: 1.0000 | ORAL_TABLET | Freq: Two times a day (BID) | ORAL | Status: DC

## 2016-01-13 MED ORDER — ACETAMINOPHEN-CODEINE #3 300-30 MG PO TABS: 1.0000 | ORAL_TABLET | Freq: Four times a day (QID) | ORAL | Status: DC | PRN

## 2016-01-13 NOTE — ED Notes (Signed)
Pt c/o increasing right ear over the last week, worse in the last 2 days. Pt was seen here at [facility] last week with right ear pain and diagnosed with Bells Palsy. Pt was given prescription for Dexamethasone and Acyclovir and has taken both of them. Pt told to return for continued or worsening right ear pain when he was discharged last week.

## 2016-01-13 NOTE — Discharge Instructions (Signed)
Please continue your current medications. Use claritin D every 12 hours. Use tylenol codeine for pain not improved by ibuprofen or tylenol This medication may cause drowsiness, use with caution.

## 2016-01-13 NOTE — ED Provider Notes (Signed)
CSN: [id number]     Arrival date & time 01/13/16  4098 History  By signing my name below, I, [facility], attest that this documentation has been prepared under the direction and in the presence of Ivery Quale, PA-C. Electronically Signed: Randell Patient, ED Scribe. 01/13/2016. 1:29 PM.   Chief Complaint  Patient presents with  . Otalgia   The history is provided by the patient and medical records. No language interpreter was used.   HPI Comments: Johnny Guzman is a 35 y.o. male with an hx of Bell's Palsy, bronchitis, and degenerative disc disorder who presents to the Emergency Department complaining of constant, unchanging, mild right ear pain onset 1 week ago, that improved with treatment but worsened in the past 2 days. Per medical records, patient was seen at the [facility] ED 11 days ago by Pauline Aus, PA-C where he was diagnosed with Bell's Palsy, prescribed Decadron and Zovirax, and advised to return if symptoms worsened or ear pain presented. He has taken these prescription medications which temporarily relieved symptoms. He denies ear discharge.  Past Medical History  Diagnosis Date  . Bronchitis   . Degenerative disc disease    History reviewed. No pertinent past surgical history. Family History  Problem Relation Age of Onset  . Hypertension Mother   . Heart failure Mother   . Hypertension Other   . Heart failure Other    Social History  Substance Use Topics  . Smoking status: Current Every Day Smoker -- 1.00 packs/day    Types: Cigarettes  . Smokeless tobacco: Former Neurosurgeon  . Alcohol Use: Yes     Comment: no etoh 1 year    Review of Systems  HENT: Positive for ear pain. Negative for ear discharge.   All other systems reviewed and are negative.     Allergies  Review of patient's allergies indicates no known allergies.  Home Medications   Prior to Admission medications   Medication Sig Start Date End Date Taking? Authorizing Provider   acetaminophen (TYLENOL) 500 MG tablet Take 500 mg by mouth every 6 (six) hours as needed.   Yes Historical Provider, MD  acyclovir (ZOVIRAX) 400 MG tablet Take 1 tablet (400 mg total) by mouth 5 (five) times daily. 01/02/16  Yes Tammy Triplett, PA-C  aspirin 325 MG tablet Take 650 mg by mouth daily as needed for mild pain.   Yes Historical Provider, MD  dexamethasone (DECADRON) 6 MG tablet Take 1 tablet (6 mg total) by mouth 2 (two) times daily. For 7 days 01/02/16  Yes Tammy Triplett, PA-C  ibuprofen (ADVIL,MOTRIN) 200 MG tablet Take 600 mg by mouth every 6 (six) hours as needed for moderate pain.    Yes Historical Provider, MD   BP 148/94 mmHg  Pulse 86  Temp(Src) 97.9 F (36.6 C) (Oral)  Resp 16  Ht 6' (1.829 m)  Wt 322 lb (146.058 kg)  BMI 43.66 kg/m2  SpO2 99% Physical Exam  Constitutional: He is oriented to person, place, and time. He appears well-developed and well-nourished. No distress.  HENT:  Head: Normocephalic and atraumatic.  Mouth/Throat: Oropharynx is clear and moist.  Mild increased redness of the right external canal. TM within normal limits. No neck tenderness. Oropharynx clear. Left facial weakness (recently diagnosed with Bell's Palsy). Nasal congestion present.  Eyes: Conjunctivae and EOM are normal.  Neck: Neck supple. No tracheal deviation present.  No cervical lymphadenopathy.  Cardiovascular: Normal rate.   Pulmonary/Chest: Effort normal. No respiratory distress.  Musculoskeletal: Normal range  of motion.  Neurological: He is alert and oriented to person, place, and time.  Skin: Skin is warm and dry.  Psychiatric: He has a normal mood and affect. His behavior is normal.  Nursing note and vitals reviewed.   ED Course  Procedures   DIAGNOSTIC STUDIES: Oxygen Saturation is 99% on RA, normal by my interpretation.    COORDINATION OF CARE: 1:29 PM Will prescribe Claritin-D and Tylenol. Discussed treatment plan with pt at bedside and pt agreed to  plan.  Labs Review Labs Reviewed - No data to display  Imaging Review No results found. I have personally reviewed and evaluated these images and lab results as part of my medical decision-making.   EKG Interpretation None      MDM  Patient has sinus congestion present. This is a residual elbow upper respiratory infection that occurred near the same time of his Bell's palsy. There is no bulging of the tympanic membrane. There is no drainage from the ear. There is some mild increased redness of the external canal. The patient will be treated with Claritin-D, and Tylenol with Codeine. The patient is to follow-up with his primary physician if not improving.    Final diagnoses:  None    **I have reviewed nursing notes, vital signs, and all appropriate lab and imaging results for this patient.*  **I personally performed the services described in this documentation, which was scribed in my presence. The recorded information has been reviewed and is accurate.Ivery Quale, PA-C 01/15/16 1835  Donnetta Hutching, MD 01/16/16 [phone]

## 2016-01-13 NOTE — ED Notes (Signed)
Seen here last week and diagnosed with bell's palsy.  C/o right ear pain for 2 days now.  And was told to return to er if he had ear pain.

## 2016-04-15 ENCOUNTER — Emergency Department (HOSPITAL_COMMUNITY)
Admission: EM | Admit: 2016-04-15 | Discharge: 2016-04-15 | Disposition: A | Discharge status: Home / Self Care | Payer: Self-pay | Attending: Emergency Medicine | Admitting: Emergency Medicine

## 2016-04-15 ENCOUNTER — Encounter (HOSPITAL_COMMUNITY): Payer: Self-pay | Admitting: *Deleted

## 2016-04-15 DIAGNOSIS — J020 Streptococcal pharyngitis: Secondary | ICD-10-CM | POA: Insufficient documentation

## 2016-04-15 DIAGNOSIS — F17210 Nicotine dependence, cigarettes, uncomplicated: Secondary | ICD-10-CM | POA: Insufficient documentation

## 2016-04-15 LAB — RAPID STREP SCREEN (MED CTR MEBANE ONLY): Streptococcus, Group A Screen (Direct): POSITIVE — AB

## 2016-04-15 MED ORDER — PENICILLIN G BENZATHINE 1200000 UNIT/2ML IM SUSP
1.2000 10*6.[IU] | Freq: Once | INTRAMUSCULAR | Status: AC
  Administered 2016-04-15: 1.2 10*6.[IU] via INTRAMUSCULAR
  Filled 2016-04-15: qty 2

## 2016-04-15 NOTE — ED Provider Notes (Signed)
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 No fevers or chills. No cough. No vomiting or diarrhea. No neck pain or neck stiffness. No rash. No known sick contacts. States he is concerned that this was tonsillitis. Has been taking over-the-counter medications with some relief.  ROS: See HPI Constitutional: no fever  Eyes: no drainage  ENT: no runny nose   Cardiovascular:  no chest pain  Resp: no SOB  GI: no vomiting GU: no dysuria Integumentary: no rash  Allergy: no hives  Musculoskeletal: no leg swelling  Neurological: no slurred speech ROS otherwise negative  PAST MEDICAL HISTORY/PAST SURGICAL HISTORY:  Past Medical History  Diagnosis Date  . Bronchitis   . Degenerative disc disease     MEDICATIONS:  Prior to Admission medications   Medication Sig Start Date End Date Taking? Authorizing Provider  acetaminophen (TYLENOL) 500 MG tablet Take 500 mg by mouth every 6 (six) hours as needed.    Historical Provider, MD  acetaminophen-codeine (TYLENOL #3) 300-30 MG tablet Take 1-2 tablets by mouth every 6 (six) hours as needed for moderate pain. 01/13/16   Ivery QualeHobson Bryant, PA-C  aspirin 325 MG tablet Take 650 mg by mouth daily as needed for mild pain.    Historical Provider, MD  ibuprofen (ADVIL,MOTRIN) 200 MG tablet Take 600 mg by mouth every 6 (six) hours as needed for moderate pain.     Historical Provider, MD  loratadine-pseudoephedrine (CLARITIN-D 12 HOUR) 5-120 MG tablet Take 1 tablet by mouth 2 (two) times daily. 01/13/16   Ivery QualeHobson Bryant, PA-C    ALLERGIES:  No Known Allergies  SOCIAL HISTORY:  Social History  Substance Use Topics  . Smoking status: Current Every Day Smoker -- 1.00 packs/day    Types: Cigarettes  . Smokeless tobacco: Former NeurosurgeonUser  . Alcohol Use: Yes   Comment: no etoh 1 year    FAMILY HISTORY: Family History  Problem Relation Age of Onset  . Hypertension Mother   . Heart failure Mother   . Hypertension Other   . Heart failure Other     EXAM: BP 129/91 mmHg  Pulse 90  Temp(Src) 98.4 F (36.9 C) (Oral)  Resp 18  Ht 6' (1.829 m)  Wt 320 lb (145.151 kg)  BMI 43.39 kg/m2  SpO2 98% CONSTITUTIONAL: Alert and oriented and responds appropriately to questions. Well-appearing; well-nourished, afebrile, nontoxic, appears well-hydrated HEAD: Normocephalic EYES: Conjunctivae clear, PERRL ENT: normal nose; no rhinorrhea; moist mucous membranes; patient has posterior pharyngeal erythema without petechiae, bilateral tonsillar hypertrophy without obvious exudate, no uvular deviation, no trismus or drooling, normal phonation, no stridor, no dental caries or abscess noted, no Ludwig's angina, tongue sits flat in the bottom of the mouth NECK: Supple, no meningismus, no LAD  CARD: RRR; S1 and S2 appreciated; no murmurs, no clicks, no rubs, no gallops RESP: Normal chest excursion without splinting or tachypnea; breath sounds clear and equal bilaterally; no wheezes, no rhonchi, no rales, no hypoxia or respiratory distress, speaking full sentences ABD/GI: Normal bowel sounds; non-distended; soft, non-tender, no rebound, no guarding, no peritoneal signs BACK:  The back appears normal and is non-tender to palpation, there is no CVA tenderness EXT: Normal ROM in all joints; non-tender to palpation; no edema; normal capillary refill; no cyanosis, no calf tenderness or swelling    SKIN:  Normal color for age and race; warm; no rash NEURO: Moves all extremities equally, sensation to light touch intact diffusely, cranial nerves II through XII intact PSYCH: The patient's mood and manner are appropriate. Grooming and personal hygiene are appropriate.  MEDICAL DECISION MAKING: Patient here with enlarged, erythematous tonsils. Nothing to suggest. Peri-tonsillar  abscess, deep space neck infection, pneumonia, meningitis.  Strep test is positive. We'll treat with one dose of IM penicillin. Have advised him to continue alternating Tylenol and ibuprofen. Recommended warm salt water gargles. Have offered work note the patient has declined. Discussed return precautions. He verbalized understanding.   At this time, I do not feel there is any life-threatening condition present. I have reviewed and discussed all results (EKG, imaging, lab, urine as appropriate), exam findings with patient. I have reviewed nursing notes and appropriate previous records.  I feel the patient is safe to be discharged home without further emergent workup. Discussed usual and customary return precautions. Patient and family (if present) verbalize understanding and are comfortable with this plan.  Patient will follow-up with their primary care provider. If they do not have a primary care provider, information for follow-up has been provided to them. All questions have been answered.      Layla Maw Ward, DO 04/15/16 [phone]

## 2016-04-15 NOTE — ED Notes (Signed)
Pt c/o sore throat x 4 days; pt states he has white patches on throat; pt has been taking OTC medications with no relief

## 2016-04-15 NOTE — Discharge Instructions (Signed)
You may alternate between Tylenol 1000 mg every 6 hours as needed for fever and pain and ibuprofen 800 mg every 8 hours as needed for fever and pain. Both of these medications are found over-the-counter. Please take ibuprofen with food. We have given you an injection of antibiotics and you will not need to be discharged home on any further antibiotics.   Strep Throat Strep throat is a bacterial infection of the throat. Your health care provider may call the infection tonsillitis or pharyngitis, depending on whether there is swelling in the tonsils or at the back of the throat. Strep throat is most common during the cold months of the year in children who are 79-9 years of age, but it can happen during any season in people of any age. This infection is spread from person to person (contagious) through coughing, sneezing, or close contact. CAUSES Strep throat is caused by the bacteria called Streptococcus pyogenes. RISK FACTORS This condition is more likely to develop in:  People who spend time in crowded places where the infection can spread easily.  People who have close contact with someone who has strep throat. SYMPTOMS Symptoms of this condition include:  Fever or chills.   Redness, swelling, or pain in the tonsils or throat.  Pain or difficulty when swallowing.  White or yellow spots on the tonsils or throat.  Swollen, tender glands in the neck or under the jaw.  Red rash all over the body (rare). DIAGNOSIS This condition is diagnosed by performing a rapid strep test or by taking a swab of your throat (throat culture test). Results from a rapid strep test are usually ready in a few minutes, but throat culture test results are available after one or two days. TREATMENT This condition is treated with antibiotic medicine. HOME CARE INSTRUCTIONS Medicines  Take over-the-counter and prescription medicines only as told by your health care provider.  Take your antibiotic as told by  your health care provider. Do not stop taking the antibiotic even if you start to feel better.  Have family members who also have a sore throat or fever tested for strep throat. They may need antibiotics if they have the strep infection. Eating and Drinking  Do not share food, drinking cups, or personal items that could cause the infection to spread to other people.  If swallowing is difficult, try eating soft foods until your sore throat feels better.  Drink enough fluid to keep your urine clear or pale yellow. General Instructions  Gargle with a salt-water mixture 3-4 times per day or as needed. To make a salt-water mixture, completely dissolve -1 tsp of salt in 1 cup of warm water.  Make sure that all household members wash their hands well.  Get plenty of rest.  Stay home from school or work until you have been taking antibiotics for 24 hours.  Keep all follow-up visits as told by your health care provider. This is important. SEEK MEDICAL CARE IF:  The glands in your neck continue to get bigger.  You develop a rash, cough, or earache.  You cough up a thick liquid that is green, yellow-brown, or bloody.  You have pain or discomfort that does not get better with medicine.  Your problems seem to be getting worse rather than better.  You have a fever. SEEK IMMEDIATE MEDICAL CARE IF:  You have new symptoms, such as vomiting, severe headache, stiff or painful neck, chest pain, or shortness of breath.  You have severe throat pain,  drooling, or changes in your voice.  You have swelling of the neck, or the skin on the neck becomes red and tender.  You have signs of dehydration, such as fatigue, dry mouth, and decreased urination.  You become increasingly sleepy, or you cannot wake up completely.  Your joints become red or painful.   This information is not intended to replace advice given to you by your health care provider. Make sure you discuss any questions you have  with your health care provider.   Document Released: 12/05/2000 Document Revised: 08/29/2015 Document Reviewed: 04/02/2015 Elsevier Interactive Patient Education [employer]2016 [employer]vier Inc.

## 2016-11-30 ENCOUNTER — Emergency Department (HOSPITAL_COMMUNITY): Payer: Self-pay

## 2016-11-30 ENCOUNTER — Emergency Department (HOSPITAL_COMMUNITY)
Admission: EM | Admit: 2016-11-30 | Discharge: 2016-11-30 | Disposition: A | Discharge status: Home / Self Care | Payer: Self-pay | Attending: Emergency Medicine | Admitting: Emergency Medicine

## 2016-11-30 ENCOUNTER — Encounter (HOSPITAL_COMMUNITY): Payer: Self-pay | Admitting: Emergency Medicine

## 2016-11-30 DIAGNOSIS — M5030 Other cervical disc degeneration, unspecified cervical region: Secondary | ICD-10-CM

## 2016-11-30 DIAGNOSIS — F17210 Nicotine dependence, cigarettes, uncomplicated: Secondary | ICD-10-CM | POA: Insufficient documentation

## 2016-11-30 DIAGNOSIS — M62838 Other muscle spasm: Secondary | ICD-10-CM

## 2016-11-30 DIAGNOSIS — M50321 Other cervical disc degeneration at C4-C5 level: Secondary | ICD-10-CM | POA: Insufficient documentation

## 2016-11-30 DIAGNOSIS — Z79899 Other long term (current) drug therapy: Secondary | ICD-10-CM | POA: Insufficient documentation

## 2016-11-30 HISTORY — DX: Unspecified osteoarthritis, unspecified site: M19.90

## 2016-11-30 MED ORDER — PREDNISONE 20 MG PO TABS
40.0000 mg | ORAL_TABLET | Freq: Once | ORAL | Status: AC
  Administered 2016-11-30: 40 mg via ORAL
  Filled 2016-11-30: qty 2

## 2016-11-30 MED ORDER — IBUPROFEN 600 MG PO TABS: 600.0000 mg | ORAL_TABLET | Freq: Four times a day (QID) | ORAL | 0 refills | Status: DC

## 2016-11-30 MED ORDER — DEXAMETHASONE 4 MG PO TABS: 4.0000 mg | ORAL_TABLET | Freq: Two times a day (BID) | ORAL | 0 refills | Status: DC

## 2016-11-30 MED ORDER — ACETAMINOPHEN 325 MG PO TABS
650.0000 mg | ORAL_TABLET | Freq: Once | ORAL | Status: AC
  Administered 2016-11-30: 650 mg via ORAL
  Filled 2016-11-30: qty 2

## 2016-11-30 MED ORDER — CYCLOBENZAPRINE HCL 10 MG PO TABS: 10.0000 mg | ORAL_TABLET | Freq: Three times a day (TID) | ORAL | 0 refills | Status: DC

## 2016-11-30 MED ORDER — IBUPROFEN 800 MG PO TABS
800.0000 mg | ORAL_TABLET | Freq: Once | ORAL | Status: AC
  Administered 2016-11-30: 800 mg via ORAL
  Filled 2016-11-30: qty 1

## 2016-11-30 MED ORDER — ONDANSETRON HCL 4 MG PO TABS
4.0000 mg | ORAL_TABLET | Freq: Once | ORAL | Status: AC
  Administered 2016-11-30: 4 mg via ORAL
  Filled 2016-11-30: qty 1

## 2016-11-30 NOTE — Discharge Instructions (Signed)
The x-ray of your cervical spine reveals degenerative disc disease and some muscle spasm present. Please use warm tub soaks to your neck and shoulders 2 times daily, or use a heating pad at rest. Please see Dr. Romeo AppleHarrison for orthopedic evaluation concerning your neck issue. Use Decadron and ibuprofen daily with food. Use Flexeril for spasm pain. This medication may cause drowsiness, please do not drive, operate machinery, drink alcohol, participate in activities requiring concentration when taking this medication.

## 2016-11-30 NOTE — ED Provider Notes (Signed)
[facility]-EMERGENCY DEPT Provider Note   CSN: [id number] Arrival date & time: 11/30/16  [id number]     History   Chief Complaint Chief Complaint  Patient presents with  . Neck Pain    HPI Johnny Guzman is a 35 y.o. male.  Patient is a 35 year old male who presents to the emergency department with a complaint of pain radiating into the left foot upper extremity.  The patient states that he symptoms, and because on for a while, but really bad within the last week. The patient denies any recent injury or trauma. There's been no recent operations or procedures involving the neck of the left upper extremity. The patient is not dropping objects, but having pain that shoots down his left arm. He states that at times it feels as though the left extremity is weaker than the right. He has tried Tylenol and heat, but states these are not helping.   The history is provided by the patient.    Past Medical History:  Diagnosis Date  . Bronchitis   . Degenerative disc disease   . DJD (degenerative joint disease)     There are no active problems to display for this patient.   History reviewed. No pertinent surgical history.     Home Medications    Prior to Admission medications   Medication Sig Start Date End Date Taking? Authorizing Provider  acetaminophen (TYLENOL) 500 MG tablet Take 500 mg by mouth every 6 (six) hours as needed.   Yes Historical Provider, MD  ibuprofen (ADVIL,MOTRIN) 200 MG tablet Take 600 mg by mouth every 6 (six) hours as needed for moderate pain.    Yes Historical Provider, MD    Family History Family History  Problem Relation Age of Onset  . Hypertension Mother   . Heart failure Mother   . Hypertension Other   . Heart failure Other     Social History Social History  Substance Use Topics  . Smoking status: Current Every Day Smoker    Packs/day: 1.00    Types: Cigarettes  . Smokeless tobacco: Former NeurosurgeonUser  . Alcohol use No     Comment: no etho 2 years      Allergies   Patient has no known allergies.   Review of Systems Review of Systems  Musculoskeletal: Positive for back pain and neck pain.  All other systems reviewed and are negative.    Physical Exam Updated Vital Signs BP 141/90 (BP Location: Left Arm)   Pulse 61   Temp 98.1 F (36.7 C) (Oral)   Resp 18   Ht 6' (1.829 m)   Wt (!) 145.2 kg   SpO2 98%   BMI 43.40 kg/m   Physical Exam  Constitutional: He is oriented to person, place, and time. He appears well-developed and well-nourished.  Non-toxic appearance.  HENT:  Head: Normocephalic.  Right Ear: Tympanic membrane and external ear normal.  Left Ear: Tympanic membrane and external ear normal.  Eyes: EOM and lids are normal. Pupils are equal, round, and reactive to light.  Neck: Normal range of motion. Neck supple. Carotid bruit is not present.  Cardiovascular: Normal rate, regular rhythm, normal heart sounds, intact distal pulses and normal pulses.   Pulmonary/Chest: Breath sounds normal. No respiratory distress.  Abdominal: Soft. Bowel sounds are normal. There is no tenderness. There is no guarding.  Musculoskeletal: Normal range of motion.       Cervical back: He exhibits pain and spasm.       Back:  Patient  has normal range of motion of the cervical spine, but with some soreness.  Patient has normal range of motion of the right and left upper extremities, but with some crepitus and some discomfort.  Lymphadenopathy:       Head (right side): No submandibular adenopathy present.       Head (left side): No submandibular adenopathy present.    He has no cervical adenopathy.  Neurological: He is alert and oriented to person, place, and time. He has normal strength. No cranial nerve deficit or sensory deficit.  Grip is symmetrical. There is no muscle atrophy noted of the right or left upper extremity. No motor deficits or strength deficits appreciated of the upper extremities.  Skin: Skin is warm and dry.   Psychiatric: He has a normal mood and affect. His speech is normal.  Nursing note and vitals reviewed.    ED Treatments / Results  Labs (all labs ordered are listed, but only abnormal results are displayed) Labs Reviewed - No data to display  EKG  EKG Interpretation None       Radiology Dg Cervical Spine Complete  Result Date: 11/30/2016 CLINICAL DATA:  Neck pain radiating to the left upper extremity for 1 week. No recent injury. EXAM: CERVICAL SPINE - COMPLETE 4+ VIEW COMPARISON:  None. FINDINGS: On the lateral view the cervical spine is visualized to the level of C6-7, with improved visualization of C7-T1 on the swimmer's view. Straightening of the cervical spine. Pre-vertebral soft tissues are within normal limits. No fracture is detected in the cervical spine. Dens is well positioned between the lateral masses of C1. Mild degenerative disc disease at C4-5. Mild loss of disc height at C5-6. No cervical spine subluxation. No significant facet arthropathy. No significant bony foraminal stenosis. No aggressive-appearing focal osseous lesions. IMPRESSION: 1. Mild degenerative disc disease in the mid to lower cervical spine. No significant bony foraminal stenosis. 2. Straightening of the cervical spine, usually due to the positioning and/or muscle spasm. Electronically Signed   By: Delbert PhenixJason A Poff M.D.   On: 11/30/2016 10:13    Procedures Procedures (including critical care time)  Medications Ordered in ED Medications - No data to display   Initial Impression / Assessment and Plan / ED Course  I have reviewed the triage vital signs and the nursing notes.  Pertinent labs & imaging results that were available during my care of the patient were reviewed by me and considered in my medical decision making (see chart for details).  Clinical Course     *I have reviewed nursing notes, vital signs, and all appropriate lab and imaging results for this patient.**  Final Clinical  Impressions(s) / ED Diagnoses  The vital signs are within normal limits. Cervical spine film reveals degenerative disc disease in the mid and lower cervical spine. There is also some straightening of the cervical spine believed to be related to muscle spasm. There no gross neurologic deficits at this time. Doubt a surgical spine/neck situation.  Patient will be treated with Flexeril, Decadron, and ibuprofen. He is to follow-up with with [facility] for additional evaluation and management.    Final diagnoses:  DDD (degenerative disc disease), cervical  Muscle spasms of neck    New Prescriptions New Prescriptions   No medications on file     Ivery QualeHobson Kesler Wickham, Cordelia Poche-C 12/02/16 1837    Raeford RazorStephen Kohut, MD 12/02/16 2054

## 2016-11-30 NOTE — ED Triage Notes (Signed)
PT c/o neck/upper back pain with radiation down left arm x1 week with no new injury. PT ambulatory from triage with NAD noted.

## 2017-04-20 ENCOUNTER — Encounter (HOSPITAL_COMMUNITY): Payer: Self-pay | Admitting: *Deleted

## 2017-04-20 ENCOUNTER — Emergency Department (HOSPITAL_COMMUNITY)
Admission: EM | Admit: 2017-04-20 | Discharge: 2017-04-20 | Disposition: A | Discharge status: Home / Self Care | Payer: 59 | Attending: Emergency Medicine | Admitting: Emergency Medicine

## 2017-04-20 DIAGNOSIS — F17210 Nicotine dependence, cigarettes, uncomplicated: Secondary | ICD-10-CM | POA: Insufficient documentation

## 2017-04-20 DIAGNOSIS — M546 Pain in thoracic spine: Secondary | ICD-10-CM | POA: Diagnosis not present

## 2017-04-20 DIAGNOSIS — M549 Dorsalgia, unspecified: Secondary | ICD-10-CM | POA: Diagnosis present

## 2017-04-20 MED ORDER — DEXAMETHASONE SODIUM PHOSPHATE 10 MG/ML IJ SOLN
10.0000 mg | Freq: Once | INTRAMUSCULAR | Status: AC
  Administered 2017-04-20: 10 mg via INTRAMUSCULAR
  Filled 2017-04-20: qty 1

## 2017-04-20 MED ORDER — CYCLOBENZAPRINE HCL 5 MG PO TABS: 5.0000 mg | ORAL_TABLET | Freq: Three times a day (TID) | ORAL | 0 refills | Status: DC | PRN

## 2017-04-20 MED ORDER — KETOROLAC TROMETHAMINE 60 MG/2ML IM SOLN
30.0000 mg | Freq: Once | INTRAMUSCULAR | Status: AC
  Administered 2017-04-20: 30 mg via INTRAMUSCULAR
  Filled 2017-04-20: qty 2

## 2017-04-20 MED ORDER — NAPROXEN 500 MG PO TABS: ORAL_TABLET | ORAL | 0 refills | Status: DC

## 2017-04-20 MED ORDER — METHOCARBAMOL 500 MG PO TABS
1000.0000 mg | ORAL_TABLET | Freq: Once | ORAL | Status: AC
  Administered 2017-04-20: 1000 mg via ORAL
  Filled 2017-04-20: qty 2

## 2017-04-20 NOTE — ED Notes (Signed)
Pt c/o increased mid to upper back pain for the past week. Denies any injury or new activity. Pt denies having any problems w/ loss of bowel or bladder control.

## 2017-04-20 NOTE — ED Provider Notes (Signed)
[facility]-EMERGENCY DEPT Provider Note   CSN: [id number] Arrival date & time: 04/20/17  0453  Time seen 05:05 AM   History   Chief Complaint Chief Complaint  Patient presents with  . Back Pain    HPI Johnny Guzman is a 36 y.o. male.  HPI  patient states he has had degenerative disc disease "for a while". He states it started last weekend (8-9 days ago) with stiffness in his upper back. He states it started getting worse on April 28 around lunchtime. He states he has a constant achy or pressure discomfort in his upper back and sometimes he gets a sharp or lightening bolt pain that is brief. He states standing for a long period of time especially on concrete or standing up from stooping will make the pain worse. He states ibuprofen helps. 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 He denies numbness or tingling of his extremities, he denies any urinary or fecal incontinence. He states this is similar to back pains he has had before.  He states last time he was at the ED he had x-rays which showed worsening of his disc disease. He states he was referred to specialist which he has not done.  PCP Ignatius Specking, MD   Past Medical History:  Diagnosis Date  . Bronchitis   . Degenerative disc disease   . DJD (degenerative joint disease)     There are no active problems to display for this patient.   History reviewed. No pertinent surgical history.     Home Medications    Prior to Admission medications   Medication Sig Start Date End Date Taking? Authorizing Provider  acetaminophen (TYLENOL) 500 MG tablet Take 500 mg by mouth every 6 (six) hours as needed.    Historical Provider, MD  cyclobenzaprine (FLEXERIL) 5 MG tablet Take 1 tablet (5 mg total) by mouth 3 (three) times daily as needed (muscle pain). 04/20/17   Devoria Albe, MD  ibuprofen (ADVIL,MOTRIN) 600 MG tablet Take 1 tablet (600 mg total) by mouth 4 (four) times daily. Take this  medication with food 11/30/16   Ivery Quale, PA-C  naproxen (NAPROSYN) 500 MG tablet Take 1 po BID with food prn pain 04/20/17   Devoria Albe, MD    Family History Family History  Problem Relation Age of Onset  . Hypertension Mother   . Heart failure Mother   . Hypertension Other   . Heart failure Other     Social History Social History  Substance Use Topics  . Smoking status: Current Every Day Smoker    Packs/day: 1.00    Types: Cigarettes  . Smokeless tobacco: Former Neurosurgeon  . Alcohol use No     Comment: no etho 2 years  employed Smokes 1 - 1 1/2 ppd   Allergies   Patient has no known allergies.   Review of Systems Review of Systems  All other systems reviewed and are negative.    Physical Exam Updated Vital Signs BP 125/79 (BP Location: Left Arm)   Pulse 82   Temp 98.2 F (36.8 C) (Oral)   Resp 20   Ht 6' (1.829 m)   Wt (!) 325 lb (147.4 kg)   SpO2 99%   BMI 44.08 kg/m   Vital signs normal    Physical Exam  Constitutional: He appears well-developed and well-nourished. No distress.  HENT:  Head: Normocephalic and atraumatic.  Right Ear: External ear normal.  Left  Ear: External ear normal.  Nose: Nose normal.  Eyes: Conjunctivae and EOM are normal.  Neck: Normal range of motion.  Cardiovascular: Normal rate.   Pulmonary/Chest: Effort normal. No respiratory distress.  Musculoskeletal: He exhibits tenderness. He exhibits no edema or deformity.       Back:  Patient has diffuse tenderness of his thoracic and paraspinous thoracic muscles that reproduces his complaints of pain. On range of motion of his back he has pain in his left paraspinous lumbar muscles when he flexes to the right, he has pain in his right lumbar paraspinous muscles when he flexes to the left, however when he flexes forward he has discomfort in his upper thoracic spine. Straight leg raising is negative bilaterally. Patellar reflexes are 2+ and equal.     ED Treatments / Results   Labs (all labs ordered are listed, but only abnormal results are displayed) Labs Reviewed - No data to display  EKG  EKG Interpretation None       Radiology No results found.   November 30, 2016 CLINICAL DATA:  Neck pain radiating to the left upper extremity for 1 week. No recent injury.  EXAM: CERVICAL SPINE - COMPLETE 4+ VIEW  COMPARISON:  None.  FINDINGS: On the lateral view the cervical spine is visualized to the level of C6-7, with improved visualization of C7-T1 on the swimmer's view. Straightening of the cervical spine. Pre-vertebral soft tissues are within normal limits. No fracture is detected in the cervical spine. Dens is well positioned between the lateral masses of C1. Mild degenerative disc disease at C4-5. Mild loss of disc height at C5-6. No cervical spine subluxation. No significant facet arthropathy. No significant bony foraminal stenosis. No aggressive-appearing focal osseous lesions.  IMPRESSION: 1. Mild degenerative disc disease in the mid to lower cervical spine. No significant bony foraminal stenosis. 2. Straightening of the cervical spine, usually due to the positioning and/or muscle spasm.   Electronically Signed   By: Delbert Phenix M.D.   On: 11/30/2016 10:13   July 27, 2010 Clinical Data: Back pain, injury   THORACIC SPINE - 2 VIEW   Comparison: None   Findings: 12 pairs of ribs. Endplate spur formation at T11-T12. Vertebral body heights maintained. No fracture, subluxation, or bone destruction.   IMPRESSION: Minimal degenerative disc disease changes lower thoracic spine. No acute bony abnormalities.  Provider: Jobie Quaker   Procedures Procedures (including critical care time)  Medications Ordered in ED Medications  ketorolac (TORADOL) injection 30 mg (not administered)  dexamethasone (DECADRON) injection 10 mg (10 mg Intramuscular Given 04/20/17 0526)  methocarbamol (ROBAXIN) tablet 1,000 mg (1,000 mg  Oral Given 04/20/17 0523)     Initial Impression / Assessment and Plan / ED Course  I have reviewed the triage vital signs and the nursing notes.  Pertinent labs & imaging results that were available during my care of the patient were reviewed by me and considered in my medical decision making (see chart for details).   Patient drove himself to the ED. He was given Toradol, Decadron, and Robaxin orally for his discomfort. I reviewed his prior x-ray studies. X-rays are not indicated tonight, he's had no acute fall or injury. I suspect he has more of a muscular component to his pain rather than degenerative disc disease since it is only described as being very mild on x-ray.  Final Clinical Impressions(s) / ED Diagnoses   Final diagnoses:  Acute bilateral thoracic back pain    New Prescriptions New Prescriptions  CYCLOBENZAPRINE (FLEXERIL) 5 MG TABLET    Take 1 tablet (5 mg total) by mouth 3 (three) times daily as needed (muscle pain).   NAPROXEN (NAPROSYN) 500 MG TABLET    Take 1 po BID with food prn pain    Plan discharge  Devoria Albe, MD, Concha Pyo, MD 04/20/17 [phone]

## 2017-04-20 NOTE — ED Triage Notes (Signed)
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

## 2017-04-20 NOTE — ED Notes (Signed)
Pt alert & oriented x4, stable gait. Patient  given discharge instructions, paperwork & prescription(s). Patient verbalized understanding. Pt left department w/ no further questions. 

## 2017-04-20 NOTE — Discharge Instructions (Signed)
Use ice and heat for comfort. Take the medications as prescribed. Follow up with Dr Sherril Croon if you aren't improving over the next 1-2 weeks or if you get worse.  Look at the back exercises and start them once you are better to strengthen the muscles in your back.

## 2017-10-10 ENCOUNTER — Emergency Department (HOSPITAL_COMMUNITY)
Admission: EM | Admit: 2017-10-10 | Discharge: 2017-10-10 | Disposition: A | Discharge status: Home Health | Payer: 59 | Attending: Emergency Medicine | Admitting: Emergency Medicine

## 2017-10-10 ENCOUNTER — Encounter (HOSPITAL_COMMUNITY): Payer: Self-pay | Admitting: Emergency Medicine

## 2017-10-10 DIAGNOSIS — F17210 Nicotine dependence, cigarettes, uncomplicated: Secondary | ICD-10-CM | POA: Diagnosis not present

## 2017-10-10 DIAGNOSIS — L237 Allergic contact dermatitis due to plants, except food: Secondary | ICD-10-CM | POA: Diagnosis present

## 2017-10-10 MED ORDER — PREDNISONE 10 MG PO TABS: ORAL_TABLET | ORAL | 0 refills | Status: AC

## 2017-10-10 MED ORDER — DEXAMETHASONE SODIUM PHOSPHATE 10 MG/ML IJ SOLN
10.0000 mg | Freq: Once | INTRAMUSCULAR | Status: AC
  Administered 2017-10-10: 10 mg via INTRAMUSCULAR
  Filled 2017-10-10: qty 1

## 2017-10-10 NOTE — ED Triage Notes (Signed)
Patient complains of poison oak rash on neck and arms x 1 week.

## 2017-10-10 NOTE — Discharge Instructions (Signed)
As discussed,  cool compresses, oral benadryl or [employer] anti itch cream (or generic) can help with itching.  Take you first dose the prednisone tablets tomorrow.

## 2017-10-10 NOTE — ED Provider Notes (Signed)
[facility] EMERGENCY DEPARTMENT Provider Note   CSN: [id number] Arrival date & time: 10/10/17  1200     History   Chief Complaint Chief Complaint  Patient presents with  . Poison Oak    HPI Johnny Guzman is a 36 y.o. 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  Reports increasing itch, redness and spreading rash around his neck and shoulders.  He has tried an OTC itch relief cream provided by his employer which was not helpful.  He denies any drainage from the rash site.  He has found no alleviators. He denies facial swelling, shortness of breath.  The history is provided by the patient.    Past Medical History:  Diagnosis Date  . Bronchitis   . Degenerative disc disease   . DJD (degenerative joint disease)     There are no active problems to display for this patient.   History reviewed. No pertinent surgical history.     Home Medications    Prior to Admission medications   Medication Sig Start Date End Date Taking? Authorizing Provider  acetaminophen (TYLENOL) 500 MG tablet Take 500 mg by mouth every 6 (six) hours as needed.   Yes [provider]  predniSONE (DELTASONE) 10 MG tablet Take 6 tablets day one, 5 tablets day two, 4 tablets day three, 3 tablets day four, 2 tablets day five, then 1 tablet day six 10/10/17   Burgess AmorIdol, Jerusalen Mateja, PA-C    Family History Family History  Problem Relation Age of Onset  . Hypertension Mother   . Heart failure Mother   . Hypertension Other   . Heart failure Other     Social History Social History  Substance Use Topics  . Smoking status: Current Every Day Smoker    Packs/day: 1.00    Types: Cigarettes  . Smokeless tobacco: Former NeurosurgeonUser  . Alcohol use No     Comment: no etho 2 years     Allergies   Patient has no known allergies.   Review of Systems Review of Systems  Constitutional: Negative for chills and fever.  Respiratory: Negative for  shortness of breath and wheezing.   Skin: Positive for rash.  Neurological: Negative for numbness.     Physical Exam Updated Vital Signs BP 118/89 (BP Location: Left Arm)   Pulse (!) 108   Temp 98.4 F (36.9 C) (Oral)   Resp 16   Ht 6' (1.829 m)   Wt (!) 149.7 kg (330 lb)   SpO2 100%   BMI 44.76 kg/m   Physical Exam  Constitutional: He appears well-developed and well-nourished. No distress.  HENT:  Head: Normocephalic.  Neck: Neck supple.  Cardiovascular: Normal rate.   Pulmonary/Chest: Effort normal. He has no wheezes.  Musculoskeletal: Normal range of motion. He exhibits no edema.  Skin: Rash noted.  Erythematous slightly raised rash with tiny vesicles on his posterior and lateral neck.  There is no drainage.  Excoriations present.     ED Treatments / Results  Labs (all labs ordered are listed, but only abnormal results are displayed) Labs Reviewed - No data to display  EKG  EKG Interpretation None       Radiology No results found.  Procedures Procedures (including critical care time)  Medications Ordered in ED Medications  dexamethasone (DECADRON) injection 10 mg (10 mg Intramuscular Given 10/10/17 1406)     Initial Impression / Assessment and Plan / ED Course  I have reviewed the triage vital signs and the nursing notes.  Pertinent labs & imaging results that were available during my care of the patient were reviewed by me and considered in my medical decision making (see chart for details).     Patient was given Decadron injection here.  Prednisone taper  Discussed strategies for itch relief.  When necessary all of the anticipated.  Final Clinical Impressions(s) / ED Diagnoses   Final diagnoses:  Poison oak    New Prescriptions Discharge Medication List as of 10/10/2017  1:57 PM    START taking these medications   Details  predniSONE (DELTASONE) 10 MG tablet Take 6 tablets day one, 5 tablets day two, 4 tablets day three, 3 tablets day  four, 2 tablets day five, then 1 tablet day six, Print         Burgess Amor, PA-C 10/10/17 1519    Azalia Bilis, MD 10/10/17 [phone]

## 2022-11-24 DIAGNOSIS — I10 Essential (primary) hypertension: Secondary | ICD-10-CM | POA: Diagnosis not present

## 2022-11-24 DIAGNOSIS — I639 Cerebral infarction, unspecified: Secondary | ICD-10-CM | POA: Diagnosis not present

## 2022-11-24 DIAGNOSIS — Z1159 Encounter for screening for other viral diseases: Secondary | ICD-10-CM | POA: Diagnosis not present

## 2022-11-24 DIAGNOSIS — Z0001 Encounter for general adult medical examination with abnormal findings: Secondary | ICD-10-CM | POA: Diagnosis not present

## 2022-12-03 DIAGNOSIS — E10621 Type 1 diabetes mellitus with foot ulcer: Secondary | ICD-10-CM | POA: Diagnosis not present

## 2022-12-03 DIAGNOSIS — L97425 Non-pressure chronic ulcer of left heel and midfoot with muscle involvement without evidence of necrosis: Secondary | ICD-10-CM | POA: Diagnosis not present

## 2022-12-04 DIAGNOSIS — Z8673 Personal history of transient ischemic attack (TIA), and cerebral infarction without residual deficits: Secondary | ICD-10-CM | POA: Diagnosis not present

## 2022-12-04 DIAGNOSIS — I639 Cerebral infarction, unspecified: Secondary | ICD-10-CM | POA: Diagnosis not present

## 2022-12-04 DIAGNOSIS — I509 Heart failure, unspecified: Secondary | ICD-10-CM | POA: Diagnosis not present

## 2022-12-04 DIAGNOSIS — Z7901 Long term (current) use of anticoagulants: Secondary | ICD-10-CM | POA: Diagnosis not present

## 2022-12-04 DIAGNOSIS — Z87891 Personal history of nicotine dependence: Secondary | ICD-10-CM | POA: Diagnosis not present

## 2022-12-04 DIAGNOSIS — Z803 Family history of malignant neoplasm of breast: Secondary | ICD-10-CM | POA: Diagnosis not present

## 2022-12-04 DIAGNOSIS — I252 Old myocardial infarction: Secondary | ICD-10-CM | POA: Diagnosis not present

## 2022-12-04 DIAGNOSIS — Z9049 Acquired absence of other specified parts of digestive tract: Secondary | ICD-10-CM | POA: Diagnosis not present

## 2022-12-04 DIAGNOSIS — I2510 Atherosclerotic heart disease of native coronary artery without angina pectoris: Secondary | ICD-10-CM | POA: Diagnosis not present

## 2022-12-04 DIAGNOSIS — E1122 Type 2 diabetes mellitus with diabetic chronic kidney disease: Secondary | ICD-10-CM | POA: Diagnosis not present

## 2022-12-04 DIAGNOSIS — Z801 Family history of malignant neoplasm of trachea, bronchus and lung: Secondary | ICD-10-CM | POA: Diagnosis not present

## 2022-12-08 DIAGNOSIS — I10 Essential (primary) hypertension: Secondary | ICD-10-CM | POA: Diagnosis not present

## 2022-12-08 DIAGNOSIS — I639 Cerebral infarction, unspecified: Secondary | ICD-10-CM | POA: Diagnosis not present

## 2022-12-08 DIAGNOSIS — R252 Cramp and spasm: Secondary | ICD-10-CM | POA: Diagnosis not present

## 2022-12-08 DIAGNOSIS — S43002S Unspecified subluxation of left shoulder joint, sequela: Secondary | ICD-10-CM | POA: Diagnosis not present

## 2022-12-08 DIAGNOSIS — I63511 Cerebral infarction due to unspecified occlusion or stenosis of right middle cerebral artery: Secondary | ICD-10-CM | POA: Diagnosis not present

## 2022-12-09 DIAGNOSIS — E11621 Type 2 diabetes mellitus with foot ulcer: Secondary | ICD-10-CM | POA: Diagnosis not present

## 2022-12-09 DIAGNOSIS — I63511 Cerebral infarction due to unspecified occlusion or stenosis of right middle cerebral artery: Secondary | ICD-10-CM | POA: Diagnosis not present

## 2022-12-09 DIAGNOSIS — L97529 Non-pressure chronic ulcer of other part of left foot with unspecified severity: Secondary | ICD-10-CM | POA: Diagnosis not present

## 2022-12-09 DIAGNOSIS — S31109A Unspecified open wound of abdominal wall, unspecified quadrant without penetration into peritoneal cavity, initial encounter: Secondary | ICD-10-CM | POA: Diagnosis not present

## 2022-12-17 DIAGNOSIS — Z5181 Encounter for therapeutic drug level monitoring: Secondary | ICD-10-CM | POA: Diagnosis not present

## 2022-12-17 DIAGNOSIS — I639 Cerebral infarction, unspecified: Secondary | ICD-10-CM | POA: Diagnosis not present

## 2022-12-18 DIAGNOSIS — I69359 Hemiplegia and hemiparesis following cerebral infarction affecting unspecified side: Secondary | ICD-10-CM | POA: Diagnosis not present

## 2022-12-18 DIAGNOSIS — I639 Cerebral infarction, unspecified: Secondary | ICD-10-CM | POA: Diagnosis not present

## 2022-12-18 DIAGNOSIS — I63411 Cerebral infarction due to embolism of right middle cerebral artery: Secondary | ICD-10-CM | POA: Diagnosis not present

## 2022-12-18 DIAGNOSIS — Z95828 Presence of other vascular implants and grafts: Secondary | ICD-10-CM | POA: Diagnosis not present

## 2022-12-23 DIAGNOSIS — M79675 Pain in left toe(s): Secondary | ICD-10-CM | POA: Diagnosis not present

## 2022-12-23 DIAGNOSIS — L97425 Non-pressure chronic ulcer of left heel and midfoot with muscle involvement without evidence of necrosis: Secondary | ICD-10-CM | POA: Diagnosis not present

## 2022-12-23 DIAGNOSIS — B351 Tinea unguium: Secondary | ICD-10-CM | POA: Diagnosis not present

## 2022-12-23 DIAGNOSIS — M79674 Pain in right toe(s): Secondary | ICD-10-CM | POA: Diagnosis not present

## 2022-12-27 DIAGNOSIS — Z5329 Procedure and treatment not carried out because of patient's decision for other reasons: Secondary | ICD-10-CM | POA: Diagnosis not present

## 2022-12-27 DIAGNOSIS — Z7901 Long term (current) use of anticoagulants: Secondary | ICD-10-CM | POA: Diagnosis not present

## 2022-12-27 DIAGNOSIS — Z794 Long term (current) use of insulin: Secondary | ICD-10-CM | POA: Diagnosis not present

## 2022-12-27 DIAGNOSIS — E1140 Type 2 diabetes mellitus with diabetic neuropathy, unspecified: Secondary | ICD-10-CM | POA: Diagnosis not present

## 2022-12-27 DIAGNOSIS — N184 Chronic kidney disease, stage 4 (severe): Secondary | ICD-10-CM | POA: Diagnosis not present

## 2022-12-27 DIAGNOSIS — I509 Heart failure, unspecified: Secondary | ICD-10-CM | POA: Diagnosis not present

## 2022-12-27 DIAGNOSIS — R200 Anesthesia of skin: Secondary | ICD-10-CM | POA: Diagnosis not present

## 2022-12-27 DIAGNOSIS — Z7902 Long term (current) use of antithrombotics/antiplatelets: Secondary | ICD-10-CM | POA: Diagnosis not present

## 2022-12-27 DIAGNOSIS — E1122 Type 2 diabetes mellitus with diabetic chronic kidney disease: Secondary | ICD-10-CM | POA: Diagnosis not present

## 2022-12-27 DIAGNOSIS — I2510 Atherosclerotic heart disease of native coronary artery without angina pectoris: Secondary | ICD-10-CM | POA: Diagnosis not present

## 2022-12-27 DIAGNOSIS — R202 Paresthesia of skin: Secondary | ICD-10-CM | POA: Diagnosis not present

## 2022-12-29 DIAGNOSIS — I2510 Atherosclerotic heart disease of native coronary artery without angina pectoris: Secondary | ICD-10-CM | POA: Diagnosis not present

## 2022-12-29 DIAGNOSIS — I5022 Chronic systolic (congestive) heart failure: Secondary | ICD-10-CM | POA: Diagnosis not present

## 2022-12-31 DIAGNOSIS — Z5181 Encounter for therapeutic drug level monitoring: Secondary | ICD-10-CM | POA: Diagnosis not present

## 2022-12-31 DIAGNOSIS — I639 Cerebral infarction, unspecified: Secondary | ICD-10-CM | POA: Diagnosis not present

## 2023-01-05 DIAGNOSIS — Z5181 Encounter for therapeutic drug level monitoring: Secondary | ICD-10-CM | POA: Diagnosis not present

## 2023-01-05 DIAGNOSIS — I639 Cerebral infarction, unspecified: Secondary | ICD-10-CM | POA: Diagnosis not present

## 2023-01-09 DIAGNOSIS — L98499 Non-pressure chronic ulcer of skin of other sites with unspecified severity: Secondary | ICD-10-CM | POA: Diagnosis not present

## 2023-01-09 DIAGNOSIS — Z8673 Personal history of transient ischemic attack (TIA), and cerebral infarction without residual deficits: Secondary | ICD-10-CM | POA: Diagnosis not present

## 2023-01-09 DIAGNOSIS — Z9641 Presence of insulin pump (external) (internal): Secondary | ICD-10-CM | POA: Diagnosis not present

## 2023-01-09 DIAGNOSIS — E10621 Type 1 diabetes mellitus with foot ulcer: Secondary | ICD-10-CM | POA: Diagnosis not present

## 2023-01-09 DIAGNOSIS — Z794 Long term (current) use of insulin: Secondary | ICD-10-CM | POA: Diagnosis not present

## 2023-01-09 DIAGNOSIS — E1022 Type 1 diabetes mellitus with diabetic chronic kidney disease: Secondary | ICD-10-CM | POA: Diagnosis not present

## 2023-01-09 DIAGNOSIS — E1040 Type 1 diabetes mellitus with diabetic neuropathy, unspecified: Secondary | ICD-10-CM | POA: Diagnosis not present

## 2023-01-09 DIAGNOSIS — L97425 Non-pressure chronic ulcer of left heel and midfoot with muscle involvement without evidence of necrosis: Secondary | ICD-10-CM | POA: Diagnosis not present

## 2023-01-09 DIAGNOSIS — N184 Chronic kidney disease, stage 4 (severe): Secondary | ICD-10-CM | POA: Diagnosis not present

## 2023-01-09 DIAGNOSIS — I5042 Chronic combined systolic (congestive) and diastolic (congestive) heart failure: Secondary | ICD-10-CM | POA: Diagnosis not present

## 2023-01-09 DIAGNOSIS — E10622 Type 1 diabetes mellitus with other skin ulcer: Secondary | ICD-10-CM | POA: Diagnosis not present

## 2023-01-09 DIAGNOSIS — I130 Hypertensive heart and chronic kidney disease with heart failure and stage 1 through stage 4 chronic kidney disease, or unspecified chronic kidney disease: Secondary | ICD-10-CM | POA: Diagnosis not present

## 2023-01-12 DIAGNOSIS — Z5181 Encounter for therapeutic drug level monitoring: Secondary | ICD-10-CM | POA: Diagnosis not present

## 2023-01-12 DIAGNOSIS — I639 Cerebral infarction, unspecified: Secondary | ICD-10-CM | POA: Diagnosis not present

## 2023-01-13 DIAGNOSIS — L97425 Non-pressure chronic ulcer of left heel and midfoot with muscle involvement without evidence of necrosis: Secondary | ICD-10-CM | POA: Diagnosis not present

## 2023-01-13 DIAGNOSIS — E10621 Type 1 diabetes mellitus with foot ulcer: Secondary | ICD-10-CM | POA: Diagnosis not present

## 2023-01-15 DIAGNOSIS — Z5181 Encounter for therapeutic drug level monitoring: Secondary | ICD-10-CM | POA: Diagnosis not present

## 2023-01-15 DIAGNOSIS — I639 Cerebral infarction, unspecified: Secondary | ICD-10-CM | POA: Diagnosis not present

## 2023-01-19 DIAGNOSIS — I5042 Chronic combined systolic (congestive) and diastolic (congestive) heart failure: Secondary | ICD-10-CM | POA: Diagnosis not present

## 2023-01-19 DIAGNOSIS — E10622 Type 1 diabetes mellitus with other skin ulcer: Secondary | ICD-10-CM | POA: Diagnosis not present

## 2023-01-19 DIAGNOSIS — S43002S Unspecified subluxation of left shoulder joint, sequela: Secondary | ICD-10-CM | POA: Diagnosis not present

## 2023-01-19 DIAGNOSIS — E1022 Type 1 diabetes mellitus with diabetic chronic kidney disease: Secondary | ICD-10-CM | POA: Diagnosis not present

## 2023-01-19 DIAGNOSIS — Z8673 Personal history of transient ischemic attack (TIA), and cerebral infarction without residual deficits: Secondary | ICD-10-CM | POA: Diagnosis not present

## 2023-01-19 DIAGNOSIS — I63511 Cerebral infarction due to unspecified occlusion or stenosis of right middle cerebral artery: Secondary | ICD-10-CM | POA: Diagnosis not present

## 2023-01-19 DIAGNOSIS — L97425 Non-pressure chronic ulcer of left heel and midfoot with muscle involvement without evidence of necrosis: Secondary | ICD-10-CM | POA: Diagnosis not present

## 2023-01-19 DIAGNOSIS — L98499 Non-pressure chronic ulcer of skin of other sites with unspecified severity: Secondary | ICD-10-CM | POA: Diagnosis not present

## 2023-01-19 DIAGNOSIS — L97529 Non-pressure chronic ulcer of other part of left foot with unspecified severity: Secondary | ICD-10-CM | POA: Diagnosis not present

## 2023-01-19 DIAGNOSIS — I10 Essential (primary) hypertension: Secondary | ICD-10-CM | POA: Diagnosis not present

## 2023-01-19 DIAGNOSIS — I639 Cerebral infarction, unspecified: Secondary | ICD-10-CM | POA: Diagnosis not present

## 2023-01-19 DIAGNOSIS — Z794 Long term (current) use of insulin: Secondary | ICD-10-CM | POA: Diagnosis not present

## 2023-01-19 DIAGNOSIS — N184 Chronic kidney disease, stage 4 (severe): Secondary | ICD-10-CM | POA: Diagnosis not present

## 2023-01-19 DIAGNOSIS — I130 Hypertensive heart and chronic kidney disease with heart failure and stage 1 through stage 4 chronic kidney disease, or unspecified chronic kidney disease: Secondary | ICD-10-CM | POA: Diagnosis not present

## 2023-01-19 DIAGNOSIS — R252 Cramp and spasm: Secondary | ICD-10-CM | POA: Diagnosis not present

## 2023-01-19 DIAGNOSIS — Z9641 Presence of insulin pump (external) (internal): Secondary | ICD-10-CM | POA: Diagnosis not present

## 2023-01-19 DIAGNOSIS — E1040 Type 1 diabetes mellitus with diabetic neuropathy, unspecified: Secondary | ICD-10-CM | POA: Diagnosis not present

## 2023-01-19 DIAGNOSIS — E11621 Type 2 diabetes mellitus with foot ulcer: Secondary | ICD-10-CM | POA: Diagnosis not present

## 2023-01-19 DIAGNOSIS — E10621 Type 1 diabetes mellitus with foot ulcer: Secondary | ICD-10-CM | POA: Diagnosis not present

## 2023-01-20 DIAGNOSIS — I70222 Atherosclerosis of native arteries of extremities with rest pain, left leg: Secondary | ICD-10-CM | POA: Diagnosis not present

## 2023-01-20 DIAGNOSIS — I639 Cerebral infarction, unspecified: Secondary | ICD-10-CM | POA: Diagnosis not present

## 2023-01-20 DIAGNOSIS — E559 Vitamin D deficiency, unspecified: Secondary | ICD-10-CM | POA: Diagnosis not present

## 2023-01-20 DIAGNOSIS — E1059 Type 1 diabetes mellitus with other circulatory complications: Secondary | ICD-10-CM | POA: Diagnosis not present

## 2023-01-20 DIAGNOSIS — H3530 Unspecified macular degeneration: Secondary | ICD-10-CM | POA: Diagnosis not present

## 2023-01-22 DIAGNOSIS — Z5181 Encounter for therapeutic drug level monitoring: Secondary | ICD-10-CM | POA: Diagnosis not present

## 2023-01-22 DIAGNOSIS — I639 Cerebral infarction, unspecified: Secondary | ICD-10-CM | POA: Diagnosis not present

## 2023-02-05 DIAGNOSIS — I10 Essential (primary) hypertension: Secondary | ICD-10-CM | POA: Diagnosis not present

## 2023-02-05 DIAGNOSIS — E1065 Type 1 diabetes mellitus with hyperglycemia: Secondary | ICD-10-CM | POA: Diagnosis not present

## 2023-02-05 DIAGNOSIS — E782 Mixed hyperlipidemia: Secondary | ICD-10-CM | POA: Diagnosis not present

## 2023-02-05 DIAGNOSIS — E039 Hypothyroidism, unspecified: Secondary | ICD-10-CM | POA: Diagnosis not present

## 2023-02-17 DIAGNOSIS — E10621 Type 1 diabetes mellitus with foot ulcer: Secondary | ICD-10-CM | POA: Diagnosis not present

## 2023-02-17 DIAGNOSIS — L97425 Non-pressure chronic ulcer of left heel and midfoot with muscle involvement without evidence of necrosis: Secondary | ICD-10-CM | POA: Diagnosis not present

## 2023-03-06 DIAGNOSIS — M5136 Other intervertebral disc degeneration, lumbar region: Secondary | ICD-10-CM | POA: Diagnosis not present

## 2023-03-06 DIAGNOSIS — F17210 Nicotine dependence, cigarettes, uncomplicated: Secondary | ICD-10-CM | POA: Diagnosis not present

## 2023-03-06 DIAGNOSIS — Z6841 Body Mass Index (BMI) 40.0 and over, adult: Secondary | ICD-10-CM | POA: Diagnosis not present

## 2023-03-06 DIAGNOSIS — Z299 Encounter for prophylactic measures, unspecified: Secondary | ICD-10-CM | POA: Diagnosis not present

## 2023-03-09 DIAGNOSIS — E11628 Type 2 diabetes mellitus with other skin complications: Secondary | ICD-10-CM | POA: Diagnosis not present

## 2023-03-09 DIAGNOSIS — L089 Local infection of the skin and subcutaneous tissue, unspecified: Secondary | ICD-10-CM | POA: Diagnosis not present

## 2023-03-09 DIAGNOSIS — M869 Osteomyelitis, unspecified: Secondary | ICD-10-CM | POA: Diagnosis not present

## 2023-03-10 DIAGNOSIS — E11628 Type 2 diabetes mellitus with other skin complications: Secondary | ICD-10-CM | POA: Diagnosis not present

## 2023-03-10 DIAGNOSIS — M869 Osteomyelitis, unspecified: Secondary | ICD-10-CM | POA: Diagnosis not present

## 2023-03-10 DIAGNOSIS — L089 Local infection of the skin and subcutaneous tissue, unspecified: Secondary | ICD-10-CM | POA: Diagnosis not present

## 2023-03-11 DIAGNOSIS — M86072 Acute hematogenous osteomyelitis, left ankle and foot: Secondary | ICD-10-CM | POA: Diagnosis not present

## 2023-03-11 DIAGNOSIS — L089 Local infection of the skin and subcutaneous tissue, unspecified: Secondary | ICD-10-CM | POA: Diagnosis not present

## 2023-03-11 DIAGNOSIS — E11628 Type 2 diabetes mellitus with other skin complications: Secondary | ICD-10-CM | POA: Diagnosis not present

## 2023-04-22 DIAGNOSIS — L97522 Non-pressure chronic ulcer of other part of left foot with fat layer exposed: Secondary | ICD-10-CM | POA: Diagnosis not present

## 2023-05-14 DIAGNOSIS — L97522 Non-pressure chronic ulcer of other part of left foot with fat layer exposed: Secondary | ICD-10-CM | POA: Diagnosis not present
